# Patient Record
Sex: Male | Born: 1995 | Race: White | Hispanic: No | Marital: Single | Smoking: Never smoker
Health system: Southern US, Community
[De-identification: ages and names within clinical notes are randomized; demographics above are authoritative.]

## PROBLEM LIST (undated history)

## (undated) HISTORY — PX: OTHER SURGICAL HISTORY: SHX169

---

## 2019-07-07 ENCOUNTER — Other Ambulatory Visit: Payer: Self-pay

## 2019-07-07 ENCOUNTER — Encounter (HOSPITAL_COMMUNITY): Payer: Self-pay | Admitting: *Deleted

## 2019-07-07 ENCOUNTER — Emergency Department (HOSPITAL_COMMUNITY): Payer: 59

## 2019-07-07 ENCOUNTER — Emergency Department (HOSPITAL_COMMUNITY)
Admission: EM | Admit: 2019-07-07 | Discharge: 2019-07-08 | Disposition: A | Discharge status: Home / Self Care | Payer: 59 | Attending: Emergency Medicine | Admitting: Emergency Medicine

## 2019-07-07 DIAGNOSIS — R0789 Other chest pain: Secondary | ICD-10-CM

## 2019-07-07 DIAGNOSIS — R079 Chest pain, unspecified: Secondary | ICD-10-CM | POA: Insufficient documentation

## 2019-07-07 LAB — BASIC METABOLIC PANEL
Anion gap: 10 (ref 5–15)
BUN: 15 mg/dL (ref 6–20)
CO2: 26 mmol/L (ref 22–32)
Calcium: 9.3 mg/dL (ref 8.9–10.3)
Chloride: 102 mmol/L (ref 98–111)
Creatinine, Ser: 1.16 mg/dL (ref 0.61–1.24)
GFR calc Af Amer: >60 mL/min (ref >60)
GFR calc non Af Amer: >60 mL/min (ref >60)
Glucose, Bld: 109 mg/dL — ABNORMAL HIGH (ref 70–99)
Potassium: 3.4 mmol/L — ABNORMAL LOW (ref 3.5–5.1)
Sodium: 138 mmol/L (ref 135–145)

## 2019-07-07 LAB — CBC
HCT: 46.1 % (ref 39.0–52.0)
Hemoglobin: 15.9 g/dL (ref 13.0–17.0)
MCH: 29.1 pg (ref 26.0–34.0)
MCHC: 34.5 g/dL (ref 30.0–36.0)
MCV: 84.3 fL (ref 80.0–100.0)
Platelets: 261 10*3/uL (ref 150–400)
RBC: 5.47 MIL/uL (ref 4.22–5.81)
RDW: 12 % (ref 11.5–15.5)
WBC: 9.4 10*3/uL (ref 4.0–10.5)
nRBC: 0 % (ref 0.0–0.2)

## 2019-07-07 LAB — TROPONIN I (HIGH SENSITIVITY): Troponin I (High Sensitivity): 2 ng/L (ref <18)

## 2019-07-07 MED ORDER — SODIUM CHLORIDE 0.9% FLUSH: 3.0000 mL | Freq: Once | INTRAVENOUS | Status: DC

## 2019-07-07 NOTE — ED Triage Notes (Signed)
Pt reports SOB/CP for 3 days.Tightness in the center of his chest, reports increased SOB when walking. No cough or fevers. Also, some palpitations yesterday.

## 2019-07-08 LAB — TROPONIN I (HIGH SENSITIVITY): Troponin I (High Sensitivity): <2 ng/L (ref <18)

## 2019-07-08 NOTE — ED Provider Notes (Signed)
[facility]   CSN: [id number] Arrival date & time: 07/07/19  2125     History   Chief Complaint Chief Complaint  Patient presents with  . Chest Pain    HPI Gary Mckay is a 23 y.o. male.    23 year old male presents to the emergency department for evaluation of chest tightness.  He states that he began experiencing tightness in the center of his chest intermittently since resuming cardiovascular activity 3 days ago.  Has been unable to go to the gym due to COVID.  Tried to get into running, but began noticing tightness shortly after physical activity.  It does happen after working out, but may also recur while at rest.  Feels slightly short of breath when tightness is present.  It can last up to an hour before spontaneously resolving.  Patient denies taking any medications for his symptoms.  He has not had any loss of smell, loss of taste, cough, congestion, fevers, body aches, syncope or near syncope, hemoptysis, leg swelling, nausea or vomiting, recent surgeries or hospitalizations, prolonged travel.  Further denies use of hormone replacement therapies.  No known family history of coagulopathies or sudden cardiac death.  The history is provided by the patient. No language interpreter was used.  Chest Pain   History reviewed. No pertinent past medical history.  There are no active problems to display for this patient.   History reviewed. No pertinent surgical history.      Home Medications    Prior to Admission medications   Not on File    Family History No family history on file.  Social History Social History   Tobacco Use  . Smoking status: Not on file  Substance Use Topics  . Alcohol use: Yes  . Drug use: Never     Allergies   Patient has no known allergies.   Review of Systems Review of Systems  Cardiovascular: Positive for chest pain.  Ten systems reviewed and are negative for acute change,  except as noted in the HPI.   Physical Exam Updated Vital Signs BP 118/72   Pulse 62   Temp 98.6 F (37 C) (Oral)   Resp 16   SpO2 99%   Physical Exam Vitals signs and nursing note reviewed.  Constitutional:      General: He is not in acute distress.    Appearance: He is well-developed. He is not diaphoretic.     Comments: Nontoxic appearing and in NAD  HENT:     Head: Normocephalic and atraumatic.  Eyes:     General: No scleral icterus.    Conjunctiva/sclera: Conjunctivae normal.  Neck:     Musculoskeletal: Normal range of motion.  Cardiovascular:     Rate and Rhythm: Normal rate and regular rhythm.     Pulses: Normal pulses.  Pulmonary:     Effort: Pulmonary effort is normal. No respiratory distress.     Breath sounds: No stridor. No wheezing, rhonchi or rales.     Comments: Respirations even and unlabored. Lungs CTAB. Musculoskeletal: Normal range of motion.     Comments: No BLE edema  Skin:    General: Skin is warm and dry.     Coloration: Skin is not pale.     Findings: No erythema or rash.  Neurological:     General: No focal deficit present.     Mental Status: He is alert and oriented to person, place, and time.     Coordination: Coordination normal.  Psychiatric:        Behavior: Behavior normal.      ED Treatments / Results  Labs (all labs ordered are listed, but only abnormal results are displayed) Labs Reviewed  BASIC METABOLIC PANEL - Abnormal; Notable for the following components:      Result Value   Potassium 3.4 (*)    Glucose, Bld 109 (*)    All other components within normal limits  CBC  TROPONIN I (HIGH SENSITIVITY)  TROPONIN I (HIGH SENSITIVITY)    EKG EKG Interpretation  Date/Time:  Monday July 07 2019 21:35:45 EDT Ventricular Rate:  78 PR Interval:  124 QRS Duration: 96 QT Interval:  352 QTC Calculation: 401 R Axis:   55 Text Interpretation:  Normal sinus rhythm with sinus arrhythmia Normal ECG NO STEMI. No old tracing to  compare Confirmed by Addison Lank [phone]) on 07/08/2019 1:44:06 AM   Radiology Dg Chest 2 View  Result Date: 07/07/2019 CLINICAL DATA:  Shortness of breath, chest pain EXAM: CHEST - 2 VIEW COMPARISON:  None. FINDINGS: Heart and mediastinal contours are within normal limits. No focal opacities or effusions. No acute bony abnormality. IMPRESSION: No active cardiopulmonary disease. Electronically Signed   By: Rolm Baptise M.D.   On: 07/07/2019 22:32    Procedures Procedures (including critical care time)  Medications Ordered in ED Medications  sodium chloride flush (NS) 0.9 % injection 3 mL (has no administration in time range)     Initial Impression / Assessment and Plan / ED Course  I have reviewed the triage vital signs and the nursing notes.  Pertinent labs & imaging results that were available during my care of the patient were reviewed by me and considered in my medical decision making (see chart for details).        Patient presents to the emergency department for evaluation of chest tightness, intermittent x 3 days and onset after he began resuming cardiovascular exercise. Patient young, otherwise healthy.  EKG is nonischemic and troponin negative x 2.  Chest x-ray without evidence of mediastinal widening to suggest dissection.  No pneumothorax, pneumonia, pleural effusion.  Pulmonary embolus further considered; however, patient without tachycardia, tachypnea, dyspnea, hypoxia.  Patient is PERC negative.  Question costochondritis given onset after resuming physical activity.  Also discussed the possibility of silent reflux.  He has no personal history of anxiety, but does endorse a family history of this.  I believe he is stable for follow-up with a primary care doctor on an outpatient basis.  Return precautions discussed and provided. Patient discharged in stable condition with no unaddressed concerns.   Final Clinical Impressions(s) / ED Diagnoses   Final diagnoses:  Chest  tightness    ED Discharge Orders    None       Antonietta Breach, PA-C 07/08/19 0313    Fatima Blank, MD 07/08/19 [phone]

## 2019-07-08 NOTE — Discharge Instructions (Signed)
We recommend follow-up with a primary care doctor.  You had ED was reassuring today.  You may return for any new or concerning symptoms.

## 2020-04-11 ENCOUNTER — Other Ambulatory Visit: Payer: Self-pay

## 2020-04-11 ENCOUNTER — Encounter (HOSPITAL_COMMUNITY): Payer: Self-pay

## 2020-04-11 ENCOUNTER — Ambulatory Visit (HOSPITAL_COMMUNITY)
Admission: EM | Admit: 2020-04-11 | Discharge: 2020-04-11 | Disposition: A | Discharge status: Home / Self Care | Payer: 59 | Attending: Family Medicine | Admitting: Family Medicine

## 2020-04-11 ENCOUNTER — Ambulatory Visit (INDEPENDENT_AMBULATORY_CARE_PROVIDER_SITE_OTHER): Payer: 59

## 2020-04-11 DIAGNOSIS — S93401A Sprain of unspecified ligament of right ankle, initial encounter: Secondary | ICD-10-CM

## 2020-04-11 MED ORDER — IBUPROFEN 800 MG PO TABS: 800.0000 mg | ORAL_TABLET | Freq: Three times a day (TID) | ORAL | 0 refills | Status: AC

## 2020-04-11 NOTE — ED Provider Notes (Signed)
[facility]    CSN: [id number] Arrival date & time: 04/11/20  1528      History   Chief Complaint Chief Complaint  Patient presents with  . Ankle Injury    HPI Gary Mckay is a 24 y.o. male presenting today for evaluation of ankle injury. Patient was playing tennis earlier today, rolled ankle fell and felt a pop. Has developed increased pain and swelling to right ankle. Denies prior injury. Denies foot or knee pain. Significant pain with weight bearing.   HPI  History reviewed. No pertinent past medical history.  There are no problems to display for this patient.   Past Surgical History:  Procedure Laterality Date  . laproscopic shoulder         Home Medications    Prior to Admission medications   Medication Sig Start Date End Date Taking? Authorizing Provider  ibuprofen (ADVIL) 800 MG tablet Take 1 tablet (800 mg total) by mouth 3 (three) times daily. 04/11/20   Jennye Runquist, Junius Creamer, PA-C    Family History No family history on file.  Social History Social History   Tobacco Use  . Smoking status: Never Smoker  . Smokeless tobacco: Never Used  Substance Use Topics  . Alcohol use: Yes  . Drug use: Never     Allergies   Patient has no known allergies.   Review of Systems Review of Systems  Constitutional: Negative for fatigue and fever.  Eyes: Negative for redness, itching and visual disturbance.  Respiratory: Negative for shortness of breath.   Cardiovascular: Negative for chest pain and leg swelling.  Gastrointestinal: Negative for nausea and vomiting.  Musculoskeletal: Positive for arthralgias, gait problem and joint swelling. Negative for myalgias.  Skin: Negative for color change, rash and wound.  Neurological: Negative for dizziness, syncope, weakness, light-headedness and headaches.     Physical Exam Triage Vital Signs ED Triage Vitals  Enc Vitals Group     BP 04/11/20 1630 120/76     Pulse Rate 04/11/20 1630 72   Resp 04/11/20 1630 16     Temp 04/11/20 1630 98.6 F (37 C)     Temp Source 04/11/20 1630 Oral     SpO2 04/11/20 1630 100 %     Weight --      Height --      Head Circumference --      Peak Flow --      Pain Score 04/11/20 1632 1     Pain Loc --      Pain Edu? --      Excl. in GC? --    No data found.  Updated Vital Signs BP 120/76 (BP Location: Left Arm)   Pulse 72   Temp 98.6 F (37 C) (Oral)   Resp 16   SpO2 100%   Visual Acuity Right Eye Distance:   Left Eye Distance:   Bilateral Distance:    Right Eye Near:   Left Eye Near:    Bilateral Near:     Physical Exam Vitals and nursing note reviewed.  Constitutional:      Appearance: He is well-developed.     Comments: No acute distress  HENT:     Head: Normocephalic and atraumatic.     Nose: Nose normal.  Eyes:     Conjunctiva/sclera: Conjunctivae normal.  Cardiovascular:     Rate and Rhythm: Normal rate.  Pulmonary:     Effort: Pulmonary effort is normal. No respiratory distress.  Abdominal:     General:  There is no distension.  Musculoskeletal:        General: Normal range of motion.     Cervical back: Neck supple.     Comments: Right ankle: swelling about lateral malleolus, no discoloration, tender to palpation, nontender to palpation over medial malleolus and throughout dorsum of foot  Patient able to dorsiflex, although slowed  Dorsalis pedis 2+  Skin:    General: Skin is warm and dry.  Neurological:     Mental Status: He is alert and oriented to person, place, and time.      UC Treatments / Results  Labs (all labs ordered are listed, but only abnormal results are displayed) Labs Reviewed - No data to display  EKG   Radiology DG Ankle Complete Right  Result Date: 04/11/2020 CLINICAL DATA:  Right ankle pain after injury. Rolled ankle today and heard a snap. Swelling laterally. Unable to bear weight. EXAM: RIGHT ANKLE - COMPLETE 3+ VIEW COMPARISON:  None. FINDINGS: No fracture or  dislocation. The alignment and ankle mortise are preserved. There is soft tissue edema most prominent laterally. Moderate-sized ankle joint effusion. IMPRESSION: Lateral soft tissue edema and ankle joint effusion. No fracture or dislocation. Electronically Signed   By: Keith Rake M.D.   On: 04/11/2020 16:47    Procedures Procedures (including critical care time)  Medications Ordered in UC Medications - No data to display  Initial Impression / Assessment and Plan / UC Course  I have reviewed the triage vital signs and the nursing notes.  Pertinent labs & imaging results that were available during my care of the patient were reviewed by me and considered in my medical decision making (see chart for details).     No acute bony abnormality on xray, treating as sprain. Crutches- weight bear as tolerated. Anti-inflammatories, ice and elevate. Follow up with sports medicine if symptoms persisting.   Discussed strict return precautions. Patient verbalized understanding and is agreeable with plan.  Final Clinical Impressions(s) / UC Diagnoses   Final diagnoses:  Sprain of right ankle, unspecified ligament, initial encounter     Discharge Instructions     NO fracture Use anti-inflammatories for pain/swelling. You may take up to 800 mg Ibuprofen every 8 hours with food. You may supplement Ibuprofen with Tylenol [phone] mg every 8 hours.  Ice and elevate Follow up with sports medicine if not seeing much improvement over the next week Weight bear as tolerated Gradually ease back into sports as pain improving with ASO ankle brace   ED Prescriptions    Medication Sig Dispense Auth. Provider   ibuprofen (ADVIL) 800 MG tablet Take 1 tablet (800 mg total) by mouth 3 (three) times daily. 21 tablet Catalino Plascencia, [place] C, PA-C     PDMP not reviewed this encounter.   Janith Lima, [place] 04/11/20 2055

## 2020-04-11 NOTE — Discharge Instructions (Addendum)
NO fracture Use anti-inflammatories for pain/swelling. You may take up to 800 mg Ibuprofen every 8 hours with food. You may supplement Ibuprofen with Tylenol [phone] mg every 8 hours.  Ice and elevate Follow up with sports medicine if not seeing much improvement over the next week Weight bear as tolerated Gradually ease back into sports as pain improving with ASO ankle brace

## 2020-04-11 NOTE — ED Notes (Addendum)
No extra tall crutches in inventory. Patient states he is 6 feet.  Fitted patient with 5'10" crutches and fitting was good.  Educated patient on using the ASO and crutches.

## 2020-04-11 NOTE — ED Triage Notes (Signed)
Patient was playing tennis and fell. Reports he was able to walk unassisted after it initially happened, but unable to now.

## 2020-06-16 ENCOUNTER — Other Ambulatory Visit: Payer: Self-pay

## 2020-06-17 ENCOUNTER — Other Ambulatory Visit (HOSPITAL_COMMUNITY)
Admission: RE | Admit: 2020-06-17 | Discharge: 2020-06-17 | Disposition: A | Discharge status: Home / Self Care | Payer: 59 | Source: Ambulatory Visit | Attending: Internal Medicine | Admitting: Internal Medicine

## 2020-06-17 ENCOUNTER — Encounter: Payer: Self-pay | Admitting: Internal Medicine

## 2020-06-17 ENCOUNTER — Ambulatory Visit (INDEPENDENT_AMBULATORY_CARE_PROVIDER_SITE_OTHER): Payer: 59 | Admitting: Internal Medicine

## 2020-06-17 VITALS — BP 120/78 | HR 75 | Temp 98.1°F | Ht 72.0 in | Wt 191.7 lb

## 2020-06-17 DIAGNOSIS — S93401D Sprain of unspecified ligament of right ankle, subsequent encounter: Secondary | ICD-10-CM

## 2020-06-17 DIAGNOSIS — Z0000 Encounter for general adult medical examination without abnormal findings: Secondary | ICD-10-CM

## 2020-06-17 DIAGNOSIS — Z113 Encounter for screening for infections with a predominantly sexual mode of transmission: Secondary | ICD-10-CM | POA: Diagnosis present

## 2020-06-17 NOTE — Patient Instructions (Signed)
-Nice seeing you today!!  --Lab work today; will notify you once results are available.  -referral to sports medicine today.  -See you back in 1 year or sooner as needed.   Preventive Care 57-24 Years Old, Male Preventive care refers to lifestyle choices and visits with your health care provider that can promote health and wellness. This includes:  A yearly physical exam. This is also called an annual well check.  Regular dental and eye exams.  Immunizations.  Screening for certain conditions.  Healthy lifestyle choices, such as eating a healthy diet, getting regular exercise, not using drugs or products that contain nicotine and tobacco, and limiting alcohol use. What can I expect for my preventive care visit? Physical exam Your health care provider will check:  Height and weight. These may be used to calculate body mass index (BMI), which is a measurement that tells if you are at a healthy weight.  Heart rate and blood pressure.  Your skin for abnormal spots. Counseling Your health care provider may ask you questions about:  Alcohol, tobacco, and drug use.  Emotional well-being.  Home and relationship well-being.  Sexual activity.  Eating habits.  Work and work Statistician. What immunizations do I need?  Influenza (flu) vaccine  This is recommended every year. Tetanus, diphtheria, and pertussis (Tdap) vaccine  You may need a Td booster every 10 years. Varicella (chickenpox) vaccine  You may need this vaccine if you have not already been vaccinated. Human papillomavirus (HPV) vaccine  If recommended by your health care provider, you may need three doses over 6 months. Measles, mumps, and rubella (MMR) vaccine  You may need at least one dose of MMR. You may also need a second dose. Meningococcal conjugate (MenACWY) vaccine  One dose is recommended if you are 67-79 years old and a Market researcher living in a residence hall, or if you have one  of several medical conditions. You may also need additional booster doses. Pneumococcal conjugate (PCV13) vaccine  You may need this if you have certain conditions and were not previously vaccinated. Pneumococcal polysaccharide (PPSV23) vaccine  You may need one or two doses if you smoke cigarettes or if you have certain conditions. Hepatitis A vaccine  You may need this if you have certain conditions or if you travel or work in places where you may be exposed to hepatitis A. Hepatitis B vaccine  You may need this if you have certain conditions or if you travel or work in places where you may be exposed to hepatitis B. Haemophilus influenzae type b (Hib) vaccine  You may need this if you have certain risk factors. You may receive vaccines as individual doses or as more than one vaccine together in one shot (combination vaccines). Talk with your health care provider about the risks and benefits of combination vaccines. What tests do I need? Blood tests  Lipid and cholesterol levels. These may be checked every 5 years starting at age 52.  Hepatitis C test.  Hepatitis B test. Screening   Diabetes screening. This is done by checking your blood sugar (glucose) after you have not eaten for a while (fasting).  Sexually transmitted disease (STD) testing. Talk with your health care provider about your test results, treatment options, and if necessary, the need for more tests. Follow these instructions at home: Eating and drinking   Eat a diet that includes fresh fruits and vegetables, whole grains, lean protein, and low-fat dairy products.  Take vitamin and mineral supplements as  recommended by your health care provider.  Do not drink alcohol if your health care provider tells you not to drink.  If you drink alcohol: ? Limit how much you have to 0-2 drinks a day. ? Be aware of how much alcohol is in your drink. In the [place] one drink equals one 12 oz bottle of beer (355 mL), one 5  oz glass of wine (148 mL), or one 1 oz glass of hard liquor (44 mL). Lifestyle  Take daily care of your teeth and gums.  Stay active. Exercise for at least 30 minutes on 5 or more days each week.  Do not use any products that contain nicotine or tobacco, such as cigarettes, e-cigarettes, and chewing tobacco. If you need help quitting, ask your health care provider.  If you are sexually active, practice safe sex. Use a condom or other form of protection to prevent STIs (sexually transmitted infections). What's next?  Go to your health care provider once a year for a well check visit.  Ask your health care provider how often you should have your eyes and teeth checked.  Stay up to date on all vaccines. This information is not intended to replace advice given to you by your health care provider. Make sure you discuss any questions you have with your health care provider. Document Revised: 12/05/2018 Document Reviewed: 12/05/2018 Elsevier Patient Education  2020 [employer].

## 2020-06-17 NOTE — Progress Notes (Signed)
New Patient Office Visit     This visit occurred during the SARS-CoV-2 public health emergency.  Safety protocols were in place, including screening questions prior to the visit, additional usage of staff PPE, and extensive cleaning of exam room while observing appropriate contact time as indicated for disinfecting solutions.    CC/Reason for Visit: Establish care, annual preventive exam, discuss acute concerns Previous PCP: None Last Visit: Unknown  HPI: Gary Mckay is a 24 y.o. male who is coming in today for the above mentioned reasons.  He has no past medical history of significance.  In late April he suffered a right ankle sprain while playing tennis.  Visited an urgent care, x-rays were negative for fracture, he was given crutches and an ankle brace.  He has improved, however he will occasionally still have swelling and pain of his ankle while playing tennis.  He is requesting STD testing.  He has no symptoms, no penile discharge.  He has had 4 male sexual partners in the past year.  He always wears condoms.  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  He has no known drug allergies, past surgical history is significant for a right shoulder labrum repair following a football injury in 2018.  He has routine eye and dental care.  His vaccinations are up-to-date including Covid x2.   Past Medical/Surgical History: History reviewed. No pertinent past medical history.  Past Surgical History:  Procedure Laterality Date  . laproscopic shoulder      Social History:  reports that he has never smoked. He has never used smokeless tobacco. He reports current alcohol use. He reports that he does not use drugs.  Allergies: No Known Allergies  Family History:    Family History  Problem Relation Age of Onset  . Depression Sister   . Anorexia nervosa Sister   . CVA Paternal Grandmother      Current Outpatient Medications:  .  ibuprofen (ADVIL) 800 MG tablet, Take 1 tablet (800 mg total) by mouth 3 (three) times daily., Disp: 21 tablet, Rfl: 0 .  loratadine (CLARITIN) 10 MG tablet, Take 10 mg by mouth daily., Disp: , Rfl:   Review of Systems:  Constitutional: Denies fever, chills, diaphoresis, appetite change and fatigue.  HEENT: Denies photophobia, eye pain, redness, hearing loss, ear pain, congestion, sore throat, rhinorrhea, sneezing, mouth sores, trouble swallowing, neck pain, neck stiffness and tinnitus.   Respiratory: Denies SOB, DOE, cough, chest tightness,  and wheezing.   Cardiovascular: Denies chest pain, palpitations and leg swelling.  Gastrointestinal: Denies nausea, vomiting, abdominal pain, diarrhea, constipation, blood in stool and abdominal distention.  Genitourinary: Denies dysuria, urgency, frequency, hematuria, flank pain and difficulty urinating.  Endocrine: Denies: hot or cold intolerance, sweats, changes in hair or nails, polyuria, polydipsia. Musculoskeletal: Denies myalgias, back pain, joint swelling, arthralgias and gait problem.  Skin: Denies pallor, rash and wound.  Neurological: Denies dizziness, seizures, syncope, weakness, light-headedness, numbness and headaches.  Hematological: Denies adenopathy. Easy bruising, personal or family bleeding history  Psychiatric/Behavioral: Denies suicidal ideation, mood changes, confusion, nervousness, sleep disturbance and agitation    Physical Exam: Vitals:   06/17/20 1533  BP: 120/78  Pulse: 75  Temp: 98.1 F (36.7 C)  TempSrc: Temporal  SpO2: 99%  Weight: 191 lb 11.2 oz (87 kg)  Height: 6' (1.829 m)   Body mass index is 26 kg/m.  Constitutional: NAD, calm, comfortable Eyes: PERRL, lids and conjunctivae normal, wears corrective lenses ENMT: Mucous membranes are  moist.  Neck: normal, supple, no masses, no thyromegaly Respiratory: clear to auscultation bilaterally, no wheezing, no crackles. Normal respiratory effort. No accessory muscle use.  Cardiovascular: Regular rate and rhythm, no murmurs / rubs / gallops. No extremity edema.  Neurologic: CN 2-12 grossly intact. Sensation intact, DTR normal. Strength 5/5 in all 4.  Psychiatric: Normal judgment and insight. Alert and oriented x 3. Normal mood.    Impression and Plan:  Encounter for preventive health examination -He has routine eye and dental care. -Immunizations are up-to-date. -He is very physically active. -Commence routine colon cancer screening age 33 and prostate cancer screening age 64.  Screen for STD (sexually transmitted disease)  - Plan: Urine cytology ancillary only, HIV antibody (with reflex), RPR, Hepatitis panel, acute -Discussed safe sexual practices.  Sprain of right ankle, unspecified ligament, subsequent encounter  - Plan: Ambulatory referral to Sports Medicine    Patient Instructions  -Nice seeing you today!!  --Lab work today; will notify you once results are available.  -referral to sports medicine today.  -See you back in 1 year or sooner as needed.   Preventive Care 33-54 Years Old, Male Preventive care refers to lifestyle choices and visits with your health care provider that can promote health and wellness. This includes:  A yearly physical exam. This is also called an annual well check.  Regular dental and eye exams.  Immunizations.  Screening for certain conditions.  Healthy lifestyle choices, such as eating a healthy diet, getting regular exercise, not using drugs or products that contain nicotine and tobacco, and limiting alcohol use. What can I expect for my preventive care visit? Physical exam Your health care provider will check:  Height and weight. These may be used to calculate body mass index (BMI), which is a measurement that tells if  you are at a healthy weight.  Heart rate and blood pressure.  Your skin for abnormal spots. Counseling Your health care provider may ask you questions about:  Alcohol, tobacco, and drug use.  Emotional well-being.  Home and relationship well-being.  Sexual activity.  Eating habits.  Work and work Statistician. What immunizations do I need?  Influenza (flu) vaccine  This is recommended every year. Tetanus, diphtheria, and pertussis (Tdap) vaccine  You may need a Td booster every 10 years. Varicella (chickenpox) vaccine  You may need this vaccine if you have not already been vaccinated. Human papillomavirus (HPV) vaccine  If recommended by your health care provider, you may need three doses over 6 months. Measles, mumps, and rubella (MMR) vaccine  You may need at least one dose of MMR. You may also need a second dose. Meningococcal conjugate (MenACWY) vaccine  One dose is recommended if you are 50-6 years old and a Market researcher living in a residence hall, or if you have one of several medical conditions. You may also need additional booster doses. Pneumococcal conjugate (PCV13) vaccine  You may need this if you have certain conditions and were not previously vaccinated. Pneumococcal polysaccharide (PPSV23) vaccine  You may need one or two doses if you smoke cigarettes or if you have certain conditions. Hepatitis A vaccine  You may need this if you have certain conditions or if you travel or work in places where you may be exposed to hepatitis A. Hepatitis B vaccine  You  may need this if you have certain conditions or if you travel or work in places where you may be exposed to hepatitis B. Haemophilus influenzae type b (Hib) vaccine  You may need this if you have certain risk factors. You may receive vaccines as individual doses or as more than one vaccine together in one shot (combination vaccines). Talk with your health care provider about the risks  and benefits of combination vaccines. What tests do I need? Blood tests  Lipid and cholesterol levels. These may be checked every 5 years starting at age 40.  Hepatitis C test.  Hepatitis B test. Screening   Diabetes screening. This is done by checking your blood sugar (glucose) after you have not eaten for a while (fasting).  Sexually transmitted disease (STD) testing. Talk with your health care provider about your test results, treatment options, and if necessary, the need for more tests. Follow these instructions at home: Eating and drinking   Eat a diet that includes fresh fruits and vegetables, whole grains, lean protein, and low-fat dairy products.  Take vitamin and mineral supplements as recommended by your health care provider.  Do not drink alcohol if your health care provider tells you not to drink.  If you drink alcohol: ? Limit how much you have to 0-2 drinks a day. ? Be aware of how much alcohol is in your drink. In the [place] one drink equals one 12 oz bottle of beer (355 mL), one 5 oz glass of wine (148 mL), or one 1 oz glass of hard liquor (44 mL). Lifestyle  Take daily care of your teeth and gums.  Stay active. Exercise for at least 30 minutes on 5 or more days each week.  Do not use any products that contain nicotine or tobacco, such as cigarettes, e-cigarettes, and chewing tobacco. If you need help quitting, ask your health care provider.  If you are sexually active, practice safe sex. Use a condom or other form of protection to prevent STIs (sexually transmitted infections). What's next?  Go to your health care provider once a year for a well check visit.  Ask your health care provider how often you should have your eyes and teeth checked.  Stay up to date on all vaccines. This information is not intended to replace advice given to you by your health care provider. Make sure you discuss any questions you have with your health care provider. Document  Revised: 12/05/2018 Document Reviewed: 12/05/2018 Elsevier Patient Education  2020 [place], [place] [facility]

## 2020-06-18 LAB — RPR: RPR Ser Ql: NONREACTIVE

## 2020-06-18 LAB — HIV ANTIBODY (ROUTINE TESTING W REFLEX): HIV 1&2 Ab, 4th Generation: NONREACTIVE

## 2020-06-21 LAB — URINE CYTOLOGY ANCILLARY ONLY
Chlamydia: NEGATIVE
Comment: NEGATIVE
Comment: NEGATIVE
Comment: NORMAL
Neisseria Gonorrhea: NEGATIVE
Trichomonas: NEGATIVE

## 2020-06-23 ENCOUNTER — Ambulatory Visit: Payer: Self-pay

## 2020-06-23 ENCOUNTER — Encounter: Payer: Self-pay | Admitting: Family Medicine

## 2020-06-23 ENCOUNTER — Other Ambulatory Visit: Payer: Self-pay

## 2020-06-23 ENCOUNTER — Ambulatory Visit (INDEPENDENT_AMBULATORY_CARE_PROVIDER_SITE_OTHER): Payer: 59 | Admitting: Family Medicine

## 2020-06-23 VITALS — BP 120/72 | HR 70 | Ht 72.0 in | Wt 195.6 lb

## 2020-06-23 DIAGNOSIS — M25571 Pain in right ankle and joints of right foot: Secondary | ICD-10-CM | POA: Diagnosis not present

## 2020-06-23 NOTE — Progress Notes (Signed)
Subjective:    I'm seeing this patient as a consultation for:  Dr. Philip Aspen. Note will be routed back to referring provider/PCP.  CC: R ankle pain and swelling  I, Molly Weber, LAT, ATC, am serving as scribe for Dr. Clementeen Graham.  HPI: Pt is a 24 y/o male presenting w/ c/o R ankle pain and swelling.  He initially inured his R ankle in mid-April 2021 when he sprained his ankle while playing tennis.  He was initially seen at the [facility] on 04/11/20 and was provided w/ crutches and an ankle brace.  His R ankle pain has improved but he con't to have pain and swelling in his ankle w/ tennis activity.  He locates his pain to his R lateral ankle w/ some radiating pain into the R lower leg/calf.  Radiating pain: yes into the R lower leg calf R ankle swelling: yes Aggravating factors: tennis; cutting; doing legs at the gym Treatments tried: HEP w/ bands; ankle brace  Diagnostic imaging: R ankle XR- 04/11/20  Past medical history, Surgical history, Family history, Social history, Allergies, and medications have been entered into the medical record, reviewed.   Review of Systems: No new headache, visual changes, nausea, vomiting, diarrhea, constipation, dizziness, abdominal pain, skin rash, fevers, chills, night sweats, weight loss, swollen lymph nodes, body aches, joint swelling, muscle aches, chest pain, shortness of breath, mood changes, visual or auditory hallucinations.   Objective:    Vitals:   06/23/20 1523  BP: 120/72  Pulse: 70  SpO2: 98%   General: Well Developed, well nourished, and in no acute distress.  Neuro/Psych: Alert and oriented x3, extra-ocular muscles intact, able to move all 4 extremities, sensation grossly intact. Skin: Warm and dry, no rashes noted.  Respiratory: Not using accessory muscles, speaking in full sentences, trachea midline.  Cardiovascular: Pulses palpable, no extremity edema. Abdomen: Does not appear distended. MSK: Right ankle  mildly swollen at anterior lateral ankle near ATFL region and at lateral malleolus. Mildly tender to palpation at ATFL region. Normal range of motion. Laxity to anterior drawer and talar tilt testing. Intact strength. Normal gait.  Lab and Radiology Results DG Ankle Complete Right  Result Date: 04/11/2020 CLINICAL DATA:  Right ankle pain after injury. Rolled ankle today and heard a snap. Swelling laterally. Unable to bear weight. EXAM: RIGHT ANKLE - COMPLETE 3+ VIEW COMPARISON:  None. FINDINGS: No fracture or dislocation. The alignment and ankle mortise are preserved. There is soft tissue edema most prominent laterally. Moderate-sized ankle joint effusion. IMPRESSION: Lateral soft tissue edema and ankle joint effusion. No fracture or dislocation. Electronically Signed   By: Narda Rutherford M.D.   On: 04/11/2020 16:47   I, Clementeen Graham, personally (independently) visualized and performed the interpretation of the images attached in this note.  Diagnostic Limited MSK Ultrasound of: Right ankle laterally Soft tissue swelling present at right anterior lateral ankle and effusion present as well. No clear bony fracture or avulsion present. Peroneal tendons normal appearing at posterior lateral ankle. Impression: Effusion and soft tissue swelling   Impression and Recommendations:    Assessment and Plan: 24 y.o. male with ankle pain and swelling 2 months after ankle sprain.  Failing home exercise program and ASO bracing.  Discussed options.  Plan for compressive ankle sleeve Voltaren gel and trial of physical therapy.  If not improving after about 4 weeks patient will notify me and we will proceed with MRI for injection or surgical planning.Marland Kitchen  PDMP not reviewed this  encounter. Orders Placed This Encounter  Procedures  . [place] LIMITED JOINT SPACE STRUCTURES LOW RIGHT(NO LINKED CHARGES)    Order Specific Question:   Reason for Exam (SYMPTOM  OR DIAGNOSIS REQUIRED)    Answer:   R lateral ankle pain      Order Specific Question:   Preferred imaging location?    Answer:   Adult nurse [facility]  . Ambulatory referral to Physical Therapy    Referral Priority:   Routine    Referral Type:   Physical Medicine    Referral Reason:   Specialty Services Required    Requested Specialty:   Physical Therapy   No orders of the defined types were placed in this encounter.   Discussed warning signs or symptoms. Please see discharge instructions. Patient expresses understanding.   The above documentation has been reviewed and is accurate and complete Clementeen Graham, M.D.

## 2020-06-23 NOTE — Patient Instructions (Signed)
Thank you for coming in today.  Plan for PT.  Use body helix full ankle sleeve during activity and for 30-60 mins following activity.  Try topical voltaren gel up to 4x daily as needed for pain and swelling.  If not improving let me know in about 4 weeks.  Next step will be MRI.  Ok to contact me sooner if not doing well or having a problem.

## 2020-07-01 ENCOUNTER — Encounter: Payer: Self-pay | Admitting: Rehabilitative and Restorative Service Providers"

## 2020-07-01 ENCOUNTER — Other Ambulatory Visit: Payer: Self-pay

## 2020-07-01 ENCOUNTER — Ambulatory Visit (INDEPENDENT_AMBULATORY_CARE_PROVIDER_SITE_OTHER): Payer: 59 | Admitting: Rehabilitative and Restorative Service Providers"

## 2020-07-01 DIAGNOSIS — M6281 Muscle weakness (generalized): Secondary | ICD-10-CM

## 2020-07-01 DIAGNOSIS — R600 Localized edema: Secondary | ICD-10-CM

## 2020-07-01 DIAGNOSIS — M25571 Pain in right ankle and joints of right foot: Secondary | ICD-10-CM | POA: Diagnosis not present

## 2020-07-01 DIAGNOSIS — R262 Difficulty in walking, not elsewhere classified: Secondary | ICD-10-CM

## 2020-07-01 NOTE — Therapy (Signed)
[facility] Physical Therapy [address] [place], [place], [address] Phone: [phone]   Fax:  [phone]  Physical Therapy Evaluation  Patient Details  Name: Gary Mckay MRN: [id number] Date of Birth: 1996/08/26 Referring Provider (PT): Dr. Clementeen Graham   Encounter Date: 07/01/2020   PT End of Session - 07/01/20 1514    Visit Number 1    Number of Visits 16    Date for PT Re-Evaluation 08/26/20    Progress Note Due on Visit 10    PT Start Time 1519    PT Stop Time 1555    PT Time Calculation (min) 36 min    Activity Tolerance Patient tolerated treatment well    Behavior During Therapy [facility] for tasks assessed/performed           History reviewed. No pertinent past medical history.  Past Surgical History:  Procedure Laterality Date  . laproscopic shoulder      There were no vitals filed for this visit.    Subjective Assessment - 07/01/20 1513    Subjective Pt. indicated onset of symptoms c spraining ankle while playing tennis.  Initially saw Urgent Care and xray performed negative at that time.  Pt. stated he initially started doing some home exercise activity with some improvement.  Pt. stated he was still having pain and swelling with increased activity and that led to additional visit to MD.  Ultrasound was performed showing ligament laxity per MD.  Has brace he uses with activity.   Workouts 4 x week at gym    Pertinent History Last MD visit update: HPI: Pt is a 24 y/o male presenting w/ c/o R ankle pain and swelling.  He initially inured his R ankle in mid-April 2021 when he sprained his ankle while playing tennis.  He was initially seen at the [facility] on 04/11/20 and was provided w/ crutches and an ankle brace.  His R ankle pain has improved but he continued to have pain and swelling in his ankle w/ tennis activity.  He locates his pain to his R lateral ankle w/ some radiating pain into the R lower leg/calf.    Limitations  Standing;Walking    Diagnostic tests Xray, ultrasoud    Patient Stated Goals Reduce pain, improve function, return to sport.    Currently in Pain? Yes    Pain Score 4    at worst   Pain Location Ankle    Pain Orientation Right    Pain Descriptors / Indicators Aching;Dull    Pain Type Chronic pain   over 6 weeks   Pain Onset More than a month ago    Pain Frequency Intermittent    Aggravating Factors  Tennis movements, uneven surface activity, heavy workouts, morning pain    Pain Relieving Factors OTC medicine, rest, band exercises.    Effect of Pain on Daily Activities Limited in recreational activity.              [facility] PT Assessment - 07/01/20 0001      Assessment   Medical Diagnosis Rt ankle pain    Referring Provider (PT) Dr. Clementeen Graham    Onset Date/Surgical Date 04/08/20    Hand Dominance Right    Prior Therapy Physical therapy s/p shoulder surgery      Precautions   Precautions None      Restrictions   Weight Bearing Restrictions No      Balance Screen   Has the patient fallen in the past 6  months No   none outside of recreational sport   Has the patient had a decrease in activity level because of a fear of falling?  Yes   2/2 pain   Is the patient reluctant to leave their home because of a fear of falling?  No      Home Environment   Living Environment Private residence    Type of Home Apartment    Home Layout One level      Prior Function   Level of Independence Independent    Leisure tennis, recreational lifting      Cognition   Overall Cognitive Status Within Functional Limits for tasks assessed      Observation/Other Assessments-Edema    Edema Figure 8      Figure 8 Edema   Figure 8 - Right  21.5 inches    Figure 8 - Left  21 inches      Sensation   Light Touch Appears Intact      Functional Tests   Functional tests Squat;Single leg stance      Squat   Comments DF tightness on Rt in lower end of squat movement       Single Leg Stance    Comments equal at 30 seconds c mild aberrant movement       ROM / Strength   AROM / PROM / Strength Strength;PROM;AROM      AROM   Overall AROM Comments Mild tightness in DF Rt ankle    AROM Assessment Site Ankle    Right/Left Ankle Left;Right    Right Ankle Dorsiflexion 8    Right Ankle Inversion 40    Right Ankle Eversion 20    Left Ankle Dorsiflexion 14      PROM   Overall PROM Comments equal to AROM on Rt ankle    PROM Assessment Site Ankle    Right/Left Ankle Left;Right      Strength   Strength Assessment Site Ankle;Knee;Hip    Right/Left Hip Left;Right    Right Hip Flexion 5/5    Left Hip Flexion 5/5    Right/Left Knee Left;Right    Right Knee Flexion 5/5    Right Knee Extension 5/5    Left Knee Flexion 5/5    Left Knee Extension 5/5    Right/Left Ankle Left;Right    Right Ankle Dorsiflexion 5/5    Right Ankle Plantar Flexion 5/5    Right Ankle Inversion 4/5    Right Ankle Eversion 5/5    Left Ankle Dorsiflexion 5/5    Left Ankle Plantar Flexion 5/5    Left Ankle Inversion 5/5    Left Ankle Eversion 5/5      Palpation   Palpation comment Mild tenderness at localized edema anterior/inferior to lateral malleolus      Special Tests   Other special tests Mild restriction in talocrural jt mobility on Rt vs. Lt      Ambulation/Gait   Gait Comments Mild DF limitation in toe off progression on Rt LE                      Objective measurements completed on examination: See above findings.       [facility] Adult PT Treatment/Exercise - 07/01/20 0001      Self-Care   Self-Care Other Self-Care Comments    Other Self-Care Comments  Self care education on swelling management c elevation, ice and compression       Exercises   Exercises Other Exercises  Other Exercises  Tband 4 way blue band 3 x 10, SLS 30 seconds x 3, - HEP instruction/performance c verbal and tacitile cues      Manual Therapy   Manual therapy comments g3/g4 ap talocrural jt mobs Rt  ankle                  PT Education - 07/01/20 1511    Education Details HEP, POC    Person(s) Educated Patient    Methods Explanation;Demonstration;Verbal cues;Handout    Comprehension Verbalized understanding;Returned demonstration               PT Long Term Goals - 07/01/20 1600      PT LONG TERM GOAL #1   Title Patient will demonstrate/report pain at worst less than or equal to 2/10 to facilitate minimal limitation in daily activity secondary to pain symptoms.    Status New    Target Date 08/26/20      PT LONG TERM GOAL #2   Title Patient will demonstrate independent use of home exercise program to facilitate ability to maintain/progress functional gains from skilled physical therapy services.    Status New    Target Date 08/26/20      PT LONG TERM GOAL #3   Title Patient will demonstrate return to work/recreational activity at previous level of function without limitations secondary due to condition    Status New    Target Date 08/26/20      PT LONG TERM GOAL #4   Title Pt. will demonstrate Rt ankle DF equal to Lt to facilitate normalized movement in gait and squat movements at PLOF.    Status New    Target Date 08/26/20      PT LONG TERM GOAL #5   Title Pt. will demonstrate Rt ankle MMT 5/5 throughout to facilitate return to PLOF in recreational activity.    Status New    Target Date 08/26/20                  Plan - 07/01/20 1511    Clinical Impression Statement Patient is a  24 y.o. male who comes to clinic with complaints of Rt ankle pain with mobility, strength and movement coordination deficits that impair their ability to perform usual daily and recreational functional activities without increase difficulty/symptoms at this time.  Patient to benefit from skilled PT services to address impairments and limitations to improve to previous level of function without restriction secondary to condition.    Examination-Activity Limitations  Squat;Stairs;Stand;Locomotion Level    Examination-Participation Restrictions Community Activity    Stability/Clinical Decision Making Stable/Uncomplicated    Clinical Decision Making Low    Rehab Potential Good    PT Frequency 2x / week    PT Duration 8 weeks   up to 8 weeks   PT Treatment/Interventions ADLs/Self Care Home Management;Electrical Stimulation;Iontophoresis 4mg /ml Dexamethasone;Moist Heat;Balance training;Therapeutic exercise;Therapeutic activities;Functional mobility training;Stair training;Cryotherapy;Gait training;Patient/family education;Ultrasound;Neuromuscular re-education;Manual techniques;Vasopneumatic Device;Taping;Dry needling;Passive range of motion;Spinal Manipulations;Joint Manipulations    PT Next Visit Plan Review HEP, strength/balance in WB as tolerated.  DF mobility    PT Home Exercise Plan [facility]    Consulted and Agree with Plan of Care Patient           Patient will benefit from skilled therapeutic intervention in order to improve the following deficits and impairments:  Abnormal gait, Decreased endurance, Hypomobility, Decreased activity tolerance, Decreased strength, Pain, Difficulty walking, Decreased mobility, Decreased balance, Decreased range of motion, Impaired perceived functional ability, Decreased coordination  Visit Diagnosis: Pain in right ankle and joints of right foot - Plan: PT plan of care cert/re-cert  Muscle weakness (generalized) - Plan: PT plan of care cert/re-cert  Difficulty in walking, not elsewhere classified - Plan: PT plan of care cert/re-cert  Localized edema - Plan: PT plan of care cert/re-cert     Problem List There are no problems to display for this patient.   Chyrel Masson, PT, DPT, OCS, ATC 07/01/20  4:21 PM    [facility] Physical Therapy [address] [place], [place], [address] Phone: [phone]   Fax:  [phone]  Name: Gary Mckay MRN: [id number] Date of Birth:  [birth year]

## 2020-07-01 NOTE — Patient Instructions (Signed)
Access Code: [email or web address] URL: [email or web address] Date: 07/01/2020 Prepared by: Chyrel Masson  Exercises Long Sitting Ankle Eversion with Resistance - 2 x daily - 7 x weekly - 10 reps - 3 sets Long Sitting Ankle Plantar Flexion with Resistance - 2 x daily - 7 x weekly - 10 reps - 3 sets Long Sitting Ankle Inversion with Resistance - 2 x daily - 7 x weekly - 10 reps - 3 sets Long Sitting Ankle Dorsiflexion with Anchored Resistance - 2 x daily - 7 x weekly - 10 reps - 3 sets Single Leg Stance - 1 x daily - 7 x weekly - 3 sets - 10 reps

## 2020-07-14 ENCOUNTER — Ambulatory Visit (INDEPENDENT_AMBULATORY_CARE_PROVIDER_SITE_OTHER): Payer: 59 | Admitting: Family Medicine

## 2020-07-14 ENCOUNTER — Other Ambulatory Visit: Payer: Self-pay

## 2020-07-14 ENCOUNTER — Encounter: Payer: Self-pay | Admitting: Physical Therapy

## 2020-07-14 ENCOUNTER — Ambulatory Visit (INDEPENDENT_AMBULATORY_CARE_PROVIDER_SITE_OTHER): Payer: 59 | Admitting: Physical Therapy

## 2020-07-14 ENCOUNTER — Encounter: Payer: Self-pay | Admitting: Family Medicine

## 2020-07-14 ENCOUNTER — Ambulatory Visit: Payer: Self-pay

## 2020-07-14 VITALS — BP 122/72 | HR 51 | Ht 72.0 in | Wt 195.0 lb

## 2020-07-14 DIAGNOSIS — M25512 Pain in left shoulder: Secondary | ICD-10-CM

## 2020-07-14 DIAGNOSIS — R262 Difficulty in walking, not elsewhere classified: Secondary | ICD-10-CM

## 2020-07-14 DIAGNOSIS — M25571 Pain in right ankle and joints of right foot: Secondary | ICD-10-CM

## 2020-07-14 DIAGNOSIS — M6281 Muscle weakness (generalized): Secondary | ICD-10-CM | POA: Diagnosis not present

## 2020-07-14 DIAGNOSIS — R600 Localized edema: Secondary | ICD-10-CM | POA: Diagnosis not present

## 2020-07-14 NOTE — Therapy (Signed)
[facility] Physical Therapy [address] [place], [place], [address] Phone: [phone]   Fax:  [phone]  Physical Therapy Treatment  Patient Details  Name: Gary Mckay MRN: [id number] Date of Birth: [birth year] Referring Provider (PT): Dr. Clementeen Graham   Encounter Date: 07/14/2020   PT End of Session - 07/14/20 1614    Visit Number 2    Number of Visits 16    Date for PT Re-Evaluation 08/26/20    Progress Note Due on Visit 10    PT Start Time 1517    PT Stop Time 1555    PT Time Calculation (min) 38 min    Activity Tolerance Patient tolerated treatment well    Behavior During Therapy [facility] for tasks assessed/performed           History reviewed. No pertinent past medical history.  Past Surgical History:  Procedure Laterality Date   laproscopic shoulder      There were no vitals filed for this visit.   Subjective Assessment - 07/14/20 1546    Subjective Pt arriving to therapy reporting no pain in his right ankle. Pt did report new anke sprain in his left ankle at the beach last [place]. R ankle is doing well.  nset of Left shoulder pain.    Pertinent History Last MD visit update: HPI: Pt is a 24 y/o male presenting w/ c/o R ankle pain and swelling.  He initially inured his R ankle in mid-April 2021 when he sprained his ankle while playing tennis.  He was initially seen at the [facility] on 04/11/20 and was provided w/ crutches and an ankle brace.  His R ankle pain has improved but he continued to have pain and swelling in his ankle w/ tennis activity.  He locates his pain to his R lateral ankle w/ some radiating pain into the R lower leg/calf.    Limitations Standing;Walking    Diagnostic tests Xray, ultrasoud    Patient Stated Goals Reduce pain, improve function, return to sport.    Currently in Pain? No/denies    Pain Location --              [facility] PT Assessment - 07/14/20 0001      Assessment   Medical Diagnosis Rt ankle  pain    Referring Provider (PT) Dr. Clementeen Graham    Onset Date/Surgical Date 04/08/20    Hand Dominance Right      AROM   AROM Assessment Site Ankle    Right/Left Ankle Right    Right Ankle Dorsiflexion 10    Right Ankle Inversion 38    Right Ankle Eversion 20                         [facility] Adult PT Treatment/Exercise - 07/14/20 0001      Exercises   Exercises Ankle    Other Exercises  table slides in flexion and ER for shoulders      Ankle Exercises: Stretches   Soleus Stretch 3 reps;20 seconds    Gastroc Stretch Limitations lunge position holding 30 seconds x 2     Slant Board Stretch 3 reps;30 seconds      Ankle Exercises: Aerobic   Recumbent Bike L3 x 6 minutes      Ankle Exercises: Standing   Vector Stance Limitations R LE stance with reaching toward 3 cones (forward, side and back)    Rocker Board 3 minutes;Limitations    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 positions    Heel Raises Both;15 reps    Toe Raise 15 reps      Ankle Exercises: Seated   BAPS Sitting;Level 4   x 20 counter clock wise and clockwise                 PT Education - 07/14/20 1613    Education Details PT POC for ankle and shoulder, Edu on movement strategies    Person(s) Educated Patient    Methods Explanation    Comprehension Verbalized understanding               PT Long Term Goals - 07/01/20 1600      PT LONG TERM GOAL #1   Title Patient will demonstrate/report pain at worst less than or equal to 2/10 to facilitate minimal limitation in daily activity secondary to pain symptoms.    Status New    Target Date 08/26/20      PT LONG TERM GOAL #2   Title Patient will demonstrate independent use of home exercise program to facilitate ability to maintain/progress functional gains from skilled physical therapy services.    Status New    Target Date 08/26/20      PT LONG TERM GOAL #3   Title Patient will demonstrate return to work/recreational activity at  previous level of function without limitations secondary due to condition    Status New    Target Date 08/26/20      PT LONG TERM GOAL #4   Title Pt. will demonstrate Rt ankle DF equal to Lt to facilitate normalized movement in gait and squat movements at PLOF.    Status New    Target Date 08/26/20      PT LONG TERM GOAL #5   Title Pt. will demonstrate Rt ankle MMT 5/5 throughout to facilitate return to PLOF in recreational activity.    Status New    Target Date 08/26/20                 Plan - 07/14/20 1617    Clinical Impression Statement Pt arriving to therapy reporting no pain in his right ankle. Pt did report new anke sprain in his left ankle at the beach last week. R ankle is doing well.  Pt tolerating high level balance exericses progressing with single leg stance activites. Still mild edema noted around R lateral malleolus. Pt also reporting new onset of Left shoulder pain. Pt reporting appointment with Dr. Denyse Amass this morning. Shoulder to be assessed at next visit. Pt was issued table slides for flexion and ER stretching which he tolerated well today. Continue skilled PT.    Examination-Activity Limitations Squat;Stairs;Stand;Locomotion Level    Examination-Participation Restrictions Community Activity    Stability/Clinical Decision Making Stable/Uncomplicated    Rehab Potential Good    PT Frequency 2x / week    PT Duration 8 weeks    PT Treatment/Interventions ADLs/Self Care Home Management;Electrical Stimulation;Iontophoresis 4mg /ml Dexamethasone;Moist Heat;Balance training;Therapeutic exercise;Therapeutic activities;Functional mobility training;Stair training;Cryotherapy;Gait training;Patient/family education;Ultrasound;Neuromuscular re-education;Manual techniques;Vasopneumatic Device;Taping;Dry needling;Passive range of motion;Spinal Manipulations;Joint Manipulations    PT Next Visit Plan Review HEP, strength/balance in WB as tolerated.  DF mobility    PT Home Exercise  Plan [facility]    Consulted and Agree with Plan of Care Patient           Patient will benefit from skilled therapeutic intervention in order to improve the following deficits and impairments:  Abnormal gait, Decreased endurance, Hypomobility, Decreased activity tolerance, Decreased strength,  Pain, Difficulty walking, Decreased mobility, Decreased balance, Decreased range of motion, Impaired perceived functional ability, Decreased coordination  Visit Diagnosis: Pain in right ankle and joints of right foot  Muscle weakness (generalized)  Difficulty in walking, not elsewhere classified  Localized edema     Problem List There are no problems to display for this patient.   Sharmon Leyden, PT, MPT 07/14/2020, 4:27 PM  [facility] Physical Therapy [address] [place], [place], [address] Phone: [phone]   Fax:  [phone]  Name: Beren Yniguez MRN: [id number] Date of Birth: [birth year]

## 2020-07-14 NOTE — Progress Notes (Signed)
   Wynema Birch, am serving as a Neurosurgeon for Dr. Antoine Primas.  Gary Mckay is a 24 y.o. male who presents to [facility] today for his L shoulder.  He was last seen by Dr. Denyse Amass on 06/23/20 for his R ankle and was advised to use a Body Helix ankle sleeve and Voltaren gel.  Since his last visit, pt reports in gyn on July 3rd for shoulder day went up in weight on arnold presses on third rep felt shoulder twinge. Has had torn labrum in that shoulder in the past and had surgery. Tightness in trap area and since then shoulder has been worse with weakness soreness and worse in morning. Sometimes a pinching feeling and noticed cracking. Worried about rotator cuff issue.    Pertinent review of systems: No fevers or chills  Relevant historical information: Largely healthy   Exam:  BP 122/72 (BP Location: Left Arm, Patient Position: Sitting, Cuff Size: Normal)   Pulse (!) 51   Ht 6' (1.829 m)   Wt 195 lb (88.5 kg)   SpO2 99%   BMI 26.45 kg/m  General: Well Developed, well nourished, and in no acute distress.   MSK: Left shoulder normal-appearing Nontender. Normal motion. Intact strength. Negative Hawkins and Neer's test. Negative Yergason's and speeds test. Positive O'Brien test. Minimally positive anterior apprehension test and relocation test.    Assessment and Plan: 24 y.o. male with left shoulder pain with weightlifting.  Likely rotator cuff strain versus very unlikely labrum injury.  Plan for physical therapy and recheck in a month.  Patient already has physical therapy scheduled for his ankle sprain and this should be an easy add-on.  If needed x-ray ultrasound injection etc.    Orders Placed This Encounter  Procedures  . Ambulatory referral to Physical Therapy    Referral Priority:   Routine    Referral Type:   Physical Medicine    Referral Reason:   Specialty Services Required    Requested Specialty:   Physical Therapy   No  orders of the defined types were placed in this encounter.    Discussed warning signs or symptoms. Please see discharge instructions. Patient expresses understanding.   The above documentation has been reviewed and is accurate and complete Clementeen Graham, M.D.

## 2020-07-14 NOTE — Patient Instructions (Signed)
Thank you for coming in today. Plan for PT. Recheck in about 1 month.

## 2020-07-28 ENCOUNTER — Ambulatory Visit (INDEPENDENT_AMBULATORY_CARE_PROVIDER_SITE_OTHER): Payer: 59 | Admitting: Rehabilitative and Restorative Service Providers"

## 2020-07-28 ENCOUNTER — Other Ambulatory Visit: Payer: Self-pay

## 2020-07-28 DIAGNOSIS — M6281 Muscle weakness (generalized): Secondary | ICD-10-CM | POA: Diagnosis not present

## 2020-07-28 DIAGNOSIS — M25512 Pain in left shoulder: Secondary | ICD-10-CM | POA: Diagnosis not present

## 2020-07-28 DIAGNOSIS — M25571 Pain in right ankle and joints of right foot: Secondary | ICD-10-CM | POA: Diagnosis not present

## 2020-07-28 DIAGNOSIS — R600 Localized edema: Secondary | ICD-10-CM

## 2020-07-28 DIAGNOSIS — R262 Difficulty in walking, not elsewhere classified: Secondary | ICD-10-CM

## 2020-07-28 NOTE — Therapy (Signed)
[facility] Physical Therapy [address] [place], [place], [address] Phone: [phone]   Fax:  [phone]  Physical Therapy Treatment/Recertification  Patient Details  Name: Gary Mckay MRN: [id number] Date of Birth: May 27, 1996 Referring Provider (PT): Dr. Clementeen Graham   Encounter Date: 07/28/2020   PT End of Session - 07/28/20 1628    Visit Number 3    Number of Visits 16    Date for PT Re-Evaluation 09/08/20    Progress Note Due on Visit 10    PT Start Time 1601    PT Stop Time 1640    PT Time Calculation (min) 39 min    Activity Tolerance Patient tolerated treatment well    Behavior During Therapy [facility] for tasks assessed/performed           No past medical history on file.  Past Surgical History:  Procedure Laterality Date  . laproscopic shoulder      There were no vitals filed for this visit.   Subjective Assessment - 07/28/20 1606    Subjective Pt. indicated Lt shoulder was hurting after doing some lifting overhead press with increased weight.  Felt twinge in shoulder and went down in weight and still had pain. Pt. stated having weakness and instability in shoulder.  Pt. stated pain started mild but then got worse over several weeks.  Pt. stated he has been doing some stretching that has seemed to help symptoms.  Pt. stated having avoid lifting weights with it at this time.  Previous history of shoulder surgery.    Pertinent History Last MD visit update: HPI: Pt is a 24 y/o male presenting w/ c/o R ankle pain and swelling.  He initially inured his R ankle in mid-April 2021 when he sprained his ankle while playing tennis.  He was initially seen at the [facility] on 04/11/20 and was provided w/ crutches and an ankle brace.  His R ankle pain has improved but he continued to have pain and swelling in his ankle w/ tennis activity.  He locates his pain to his R lateral ankle w/ some radiating pain into the R lower leg/calf.    Limitations  Standing;Walking    Diagnostic tests Xray, ultrasoud    Patient Stated Goals Reduce pain, improve function, return to sport.    Currently in Pain? Yes    Pain Score 0-No pain   at worst 3/10   Pain Orientation Right    Pain Descriptors / Indicators Aching    Pain Onset More than a month ago    Pain Frequency Occasional    Aggravating Factors  running.    Pain Relieving Factors HEP    Effect of Pain on Daily Activities recreational activity    Multiple Pain Sites Yes    Pain Score 2   at worst 3/10   Pain Location Shoulder    Pain Orientation Left    Pain Descriptors / Indicators Aching;Sharp    Pain Type Acute pain    Pain Onset More than a month ago    Pain Frequency Intermittent    Aggravating Factors  Overhead reaching/lifting, reaching behind back    Pain Relieving Factors some of the stretches    Effect of Pain on Daily Activities Limiting in workout activity.              [facility] PT Assessment - 07/28/20 0001      Assessment   Medical Diagnosis Rt ankle pain, Lt shoulder pain    Referring Provider (  PT) Dr. Clementeen Graham    Onset Date/Surgical Date 04/08/20   06/25/2020 shoulder   Hand Dominance Right      Precautions   Precautions None      Balance Screen   Has the patient fallen in the past 6 months No    Has the patient had a decrease in activity level because of a fear of falling?  Yes   2/2 condition   Is the patient reluctant to leave their home because of a fear of falling?  No      Home Environment   Living Environment Private residence    Type of Home Apartment    Home Layout One level      Prior Function   Level of Independence Independent      AROM   Overall AROM Comments Rt HBB T6, Lt HBB T5.  WFL Lt GH jt AROM    AROM Assessment Site Shoulder    Right/Left Shoulder Left;Right      PROM   PROM Assessment Site Shoulder    Right/Left Shoulder Left;Right      Strength   Strength Assessment Site Shoulder    Right/Left Shoulder Left;Right    Right  Shoulder Flexion 5/5    Right Shoulder ABduction 5/5    Right Shoulder Internal Rotation 5/5    Right Shoulder External Rotation 5/5    Left Shoulder Flexion 4+/5    Left Shoulder ABduction 5/5    Left Shoulder Internal Rotation 5/5    Left Shoulder External Rotation 5/5      Palpation   Palpation comment TrP noted in Lt infraspinatus c concordant symptoms                         [facility] Adult PT Treatment/Exercise - 07/28/20 0001      Exercises   Exercises Shoulder      Manual Therapy   Manual therapy comments compression to Lt infraspinatus, prom            Trigger Point Dry Needling - 07/28/20 0001    Consent Given? Yes    Education Handout Provided Yes    Muscles Treated Upper Quadrant Infraspinatus   Lt   Infraspinatus Response Twitch response elicited                PT Education - 07/28/20 1628    Education Details DN    Person(s) Educated Patient    Methods Explanation;Verbal cues;Handout    Comprehension Verbalized understanding               PT Long Term Goals - 07/28/20 1625      PT LONG TERM GOAL #1   Title Patient will demonstrate/report pain at worst less than or equal to 2/10 to facilitate minimal limitation in daily activity secondary to pain symptoms.    Status On-going    Target Date 09/08/20      PT LONG TERM GOAL #2   Title Patient will demonstrate independent use of home exercise program to facilitate ability to maintain/progress functional gains from skilled physical therapy services.    Status On-going    Target Date 09/08/20      PT LONG TERM GOAL #3   Title Patient will demonstrate return to work/recreational activity at previous level of function without limitations secondary due to condition    Status On-going    Target Date 09/08/20      PT LONG TERM GOAL #4  Title Pt. will demonstrate Rt ankle DF equal to Lt to facilitate normalized movement in gait and squat movements at PLOF.    Status On-going     Target Date 09/08/20      PT LONG TERM GOAL #5   Title Pt. will demonstrate Rt ankle MMT 5/5 throughout to facilitate return to PLOF in recreational activity.    Status On-going    Target Date 09/08/20      Additional Long Term Goals   Additional Long Term Goals Yes      PT LONG TERM GOAL #6   Title Pt. will demonstrate Lt UE MMT 5/5 throughout to facilitate usual daily and recreational activity at PLOF s limitation.    Status New    Target Date 09/08/20                 Plan - 07/28/20 1631    Clinical Impression Statement Patient is a 24 y.o. male who comes to clinic with complaints of Lt shoulder pain, Rt ankle/foot pain with mobility, strength and movement coordination deficits that impair their ability to perform usual daily and recreational functional activities without increase difficulty/symptoms at this time.  Patient to benefit from skilled PT services to address impairments and limitations to improve to previous level of function without restriction secondary to condition.    Examination-Activity Limitations Squat;Stairs;Stand;Locomotion Level    Examination-Participation Restrictions Community Activity    Stability/Clinical Decision Making Stable/Uncomplicated    Clinical Decision Making Low    Rehab Potential Good    PT Frequency 2x / week   1-2x/week   PT Duration 6 weeks    PT Treatment/Interventions ADLs/Self Care Home Management;Electrical Stimulation;Iontophoresis 4mg /ml Dexamethasone;Moist Heat;Balance training;Therapeutic exercise;Therapeutic activities;Functional mobility training;Stair training;Cryotherapy;Gait training;Patient/family education;Ultrasound;Neuromuscular re-education;Manual techniques;Vasopneumatic Device;Taping;Dry needling;Passive range of motion;Spinal Manipulations;Joint Manipulations    PT Next Visit Plan DN if warranted.  Continued ankle stability/agility intervention    PT Home Exercise Plan [facility]    Consulted and Agree with Plan of  Care Patient           Patient will benefit from skilled therapeutic intervention in order to improve the following deficits and impairments:  Abnormal gait, Decreased endurance, Hypomobility, Decreased activity tolerance, Decreased strength, Pain, Difficulty walking, Decreased mobility, Decreased balance, Decreased range of motion, Impaired perceived functional ability, Decreased coordination, Impaired tone, Increased edema, Impaired UE functional use, Increased fascial restricitons  Visit Diagnosis: Pain in right ankle and joints of right foot  Muscle weakness (generalized)  Acute pain of left shoulder  Difficulty in walking, not elsewhere classified  Localized edema     Problem List There are no problems to display for this patient.  [facility], PT, DPT, OCS, ATC 07/28/20  4:39 PM    [facility] Physical Therapy [address] [place], [place], [place] Phone: [phone]   Fax:  [phone]  Name: Gary Mckay MRN: [name] Date of Birth: 01-12-1996

## 2020-08-03 ENCOUNTER — Ambulatory Visit (INDEPENDENT_AMBULATORY_CARE_PROVIDER_SITE_OTHER): Payer: 59 | Admitting: Rehabilitative and Restorative Service Providers"

## 2020-08-03 ENCOUNTER — Encounter: Payer: Self-pay | Admitting: Rehabilitative and Restorative Service Providers"

## 2020-08-03 ENCOUNTER — Other Ambulatory Visit: Payer: Self-pay

## 2020-08-03 DIAGNOSIS — M6281 Muscle weakness (generalized): Secondary | ICD-10-CM

## 2020-08-03 DIAGNOSIS — M25571 Pain in right ankle and joints of right foot: Secondary | ICD-10-CM

## 2020-08-03 DIAGNOSIS — R262 Difficulty in walking, not elsewhere classified: Secondary | ICD-10-CM | POA: Diagnosis not present

## 2020-08-03 DIAGNOSIS — R600 Localized edema: Secondary | ICD-10-CM

## 2020-08-03 DIAGNOSIS — M25512 Pain in left shoulder: Secondary | ICD-10-CM

## 2020-08-03 NOTE — Therapy (Signed)
[facility] Physical Therapy [address] [place], [place], [address] Phone: [phone]   Fax:  [phone]  Physical Therapy Treatment  Patient Details  Name: Gary Mckay MRN: [id number] Date of Birth: Oct 28, 1996 Referring Provider (PT): Dr. Clementeen Graham   Encounter Date: 08/03/2020   PT End of Session - 08/03/20 1604    Visit Number 4    Number of Visits 16    Date for PT Re-Evaluation 09/08/20    Progress Note Due on Visit 10    PT Start Time 1603    PT Stop Time 1641    PT Time Calculation (min) 38 min    Activity Tolerance Patient tolerated treatment well    Behavior During Therapy [facility] for tasks assessed/performed           History reviewed. No pertinent past medical history.  Past Surgical History:  Procedure Laterality Date  . laproscopic shoulder      There were no vitals filed for this visit.   Subjective Assessment - 08/03/20 1613    Subjective Pt. indicated feeling improvement in shoulder and ankle symptoms.  Pt. stated he did some return to gym recently with better results.  Went hiking 6 miles with some mild swelling afterwards.    Pertinent History Last MD visit update: HPI: Pt is a 24 y/o male presenting w/ c/o R ankle pain and swelling.  He initially inured his R ankle in mid-April 2021 when he sprained his ankle while playing tennis.  He was initially seen at the [facility] on 04/11/20 and was provided w/ crutches and an ankle brace.  His R ankle pain has improved but he continued to have pain and swelling in his ankle w/ tennis activity.  He locates his pain to his R lateral ankle w/ some radiating pain into the R lower leg/calf.    Limitations Standing;Walking    Diagnostic tests Xray, ultrasoud    Patient Stated Goals Reduce pain, improve function, return to sport.    Currently in Pain? No/denies    Pain Score 0-No pain    Pain Onset More than a month ago    Pain Score 0    Pain Onset More than a month ago                               [facility] Adult PT Treatment/Exercise - 08/03/20 0001      Shoulder Exercises: ROM/Strengthening   UBE (Upper Arm Bike) Lvl 2.5 3 mins fwd/back each way      Manual Therapy   Manual therapy comments compression to Lt infraspinatus, prom                       PT Long Term Goals - 07/28/20 1625      PT LONG TERM GOAL #1   Title Patient will demonstrate/report pain at worst less than or equal to 2/10 to facilitate minimal limitation in daily activity secondary to pain symptoms.    Status On-going    Target Date 09/08/20      PT LONG TERM GOAL #2   Title Patient will demonstrate independent use of home exercise program to facilitate ability to maintain/progress functional gains from skilled physical therapy services.    Status On-going    Target Date 09/08/20      PT LONG TERM GOAL #3   Title Patient will demonstrate return to work/recreational activity at previous  level of function without limitations secondary due to condition    Status On-going    Target Date 09/08/20      PT LONG TERM GOAL #4   Title Pt. will demonstrate Rt ankle DF equal to Lt to facilitate normalized movement in gait and squat movements at PLOF.    Status On-going    Target Date 09/08/20      PT LONG TERM GOAL #5   Title Pt. will demonstrate Rt ankle MMT 5/5 throughout to facilitate return to PLOF in recreational activity.    Status On-going    Target Date 09/08/20      Additional Long Term Goals   Additional Long Term Goals Yes      PT LONG TERM GOAL #6   Title Pt. will demonstrate Lt UE MMT 5/5 throughout to facilitate usual daily and recreational activity at PLOF s limitation.    Status New    Target Date 09/08/20                 Plan - 08/03/20 1614    Clinical Impression Statement Positive improvements noted at this time related to both treatment areas.  Pt. is able and recommended to continue to increase activity load to previous  level based off symptom responses.  Continued use of HEP indicated and skilled PT for near future symptom reductions.    Examination-Activity Limitations Squat;Stairs;Stand;Locomotion Level    Examination-Participation Restrictions Community Activity    Stability/Clinical Decision Making Stable/Uncomplicated    Rehab Potential Good    PT Frequency 2x / week   1-2x/week   PT Duration 6 weeks    PT Treatment/Interventions ADLs/Self Care Home Management;Electrical Stimulation;Iontophoresis 4mg /ml Dexamethasone;Moist Heat;Balance training;Therapeutic exercise;Therapeutic activities;Functional mobility training;Stair training;Cryotherapy;Gait training;Patient/family education;Ultrasound;Neuromuscular re-education;Manual techniques;Vasopneumatic Device;Taping;Dry needling;Passive range of motion;Spinal Manipulations;Joint Manipulations    PT Next Visit Plan DN, UE and LE strengthening.    PT Home Exercise Plan [facility]    Consulted and Agree with Plan of Care Patient           Patient will benefit from skilled therapeutic intervention in order to improve the following deficits and impairments:  Abnormal gait, Decreased endurance, Hypomobility, Decreased activity tolerance, Decreased strength, Pain, Difficulty walking, Decreased mobility, Decreased balance, Decreased range of motion, Impaired perceived functional ability, Decreased coordination, Impaired tone, Increased edema, Impaired UE functional use, Increased fascial restricitons  Visit Diagnosis: Pain in right ankle and joints of right foot  Muscle weakness (generalized)  Acute pain of left shoulder  Difficulty in walking, not elsewhere classified  Localized edema     Problem List There are no problems to display for this patient.   [facility], PT, DPT, OCS, ATC 08/03/20  4:39 PM    [facility] Physical Therapy [address] [place], [place], [place] Phone: [phone]   Fax:  [phone]  Name:  Gary Mckay MRN: [name] Date of Birth: Nov 30, 1996

## 2020-08-11 ENCOUNTER — Ambulatory Visit (INDEPENDENT_AMBULATORY_CARE_PROVIDER_SITE_OTHER): Payer: 59 | Admitting: Rehabilitative and Restorative Service Providers"

## 2020-08-11 ENCOUNTER — Encounter: Payer: Self-pay | Admitting: Rehabilitative and Restorative Service Providers"

## 2020-08-11 ENCOUNTER — Other Ambulatory Visit: Payer: Self-pay

## 2020-08-11 DIAGNOSIS — M25571 Pain in right ankle and joints of right foot: Secondary | ICD-10-CM

## 2020-08-11 DIAGNOSIS — R600 Localized edema: Secondary | ICD-10-CM

## 2020-08-11 DIAGNOSIS — M6281 Muscle weakness (generalized): Secondary | ICD-10-CM

## 2020-08-11 DIAGNOSIS — M25512 Pain in left shoulder: Secondary | ICD-10-CM | POA: Diagnosis not present

## 2020-08-11 DIAGNOSIS — R262 Difficulty in walking, not elsewhere classified: Secondary | ICD-10-CM | POA: Diagnosis not present

## 2020-08-11 NOTE — Therapy (Signed)
[facility] Physical Therapy [address] [place], [place], [address] Phone: [phone]   Fax:  [phone]  Physical Therapy Treatment  Patient Details  Name: Gary Mckay MRN: [id number] Date of Birth: June 12, 1996 Referring Provider (PT): Dr. Clementeen Graham   Encounter Date: 08/11/2020   PT End of Session - 08/11/20 1523    Visit Number 5    Number of Visits 16    Date for PT Re-Evaluation 09/08/20    Progress Note Due on Visit 10    PT Start Time 1522    PT Stop Time 1600    PT Time Calculation (min) 38 min    Activity Tolerance Patient tolerated treatment well    Behavior During Therapy [facility] for tasks assessed/performed           History reviewed. No pertinent past medical history.  Past Surgical History:  Procedure Laterality Date   laproscopic shoulder      There were no vitals filed for this visit.   Subjective Assessment - 08/11/20 1528    Subjective Pt. stated less pain overall in shoulder.  Some instability feeling at times reported.  Pt. indicated ankle doing better as well.    Pertinent History Last MD visit update: HPI: Pt is a 24 y/o male presenting w/ c/o R ankle pain and swelling.  He initially inured his R ankle in mid-April 2021 when he sprained his ankle while playing tennis.  He was initially seen at the [facility] on 04/11/20 and was provided w/ crutches and an ankle brace.  His R ankle pain has improved but he continued to have pain and swelling in his ankle w/ tennis activity.  He locates his pain to his R lateral ankle w/ some radiating pain into the R lower leg/calf.    Limitations Standing;Walking    Diagnostic tests Xray, ultrasoud    Patient Stated Goals Reduce pain, improve function, return to sport.    Currently in Pain? No/denies    Pain Score 0-No pain    Pain Onset More than a month ago    Multiple Pain Sites Yes    Pain Score 0    Pain Onset More than a month ago                              [facility] Adult PT Treatment/Exercise - 08/11/20 0001      Shoulder Exercises: Standing   Other Standing Exercises kettle bell 90/90 body rotation way 2 x 10    Other Standing Exercises Bodyblade 30 sec bouts x 4 er/ir, 90 deg flexion, abd 30 sec 2 each Lt UE      Shoulder Exercises: ROM/Strengthening   UBE (Upper Arm Bike) Lvl 3.5 5 mins fwd/back each way c 10 seconds interval each minute                       PT Long Term Goals - 07/28/20 1625      PT LONG TERM GOAL #1   Title Patient will demonstrate/report pain at worst less than or equal to 2/10 to facilitate minimal limitation in daily activity secondary to pain symptoms.    Status On-going    Target Date 09/08/20      PT LONG TERM GOAL #2   Title Patient will demonstrate independent use of home exercise program to facilitate ability to maintain/progress functional gains from skilled physical therapy services.  Status On-going    Target Date 09/08/20      PT LONG TERM GOAL #3   Title Patient will demonstrate return to work/recreational activity at previous level of function without limitations secondary due to condition    Status On-going    Target Date 09/08/20      PT LONG TERM GOAL #4   Title Pt. will demonstrate Rt ankle DF equal to Lt to facilitate normalized movement in gait and squat movements at PLOF.    Status On-going    Target Date 09/08/20      PT LONG TERM GOAL #5   Title Pt. will demonstrate Rt ankle MMT 5/5 throughout to facilitate return to PLOF in recreational activity.    Status On-going    Target Date 09/08/20      Additional Long Term Goals   Additional Long Term Goals Yes      PT LONG TERM GOAL #6   Title Pt. will demonstrate Lt UE MMT 5/5 throughout to facilitate usual daily and recreational activity at PLOF s limitation.    Status New    Target Date 09/08/20                 Plan - 08/11/20 1541    Clinical Impression Statement Pt.  continued to reported reduction in overall symptoms in both areas as well as transitioning back to usual recreational activity c modifications (lighter weight in gym, etc).  Pt. continued to present c indication for improved proprioceptive control for Lt UE and endurace to improve control/positioning.    Examination-Activity Limitations Squat;Stairs;Stand;Locomotion Level    Examination-Participation Restrictions Community Activity    Stability/Clinical Decision Making Stable/Uncomplicated    Rehab Potential Good    PT Frequency 2x / week   1-2x/week   PT Duration 6 weeks    PT Treatment/Interventions ADLs/Self Care Home Management;Electrical Stimulation;Iontophoresis 4mg /ml Dexamethasone;Moist Heat;Balance training;Therapeutic exercise;Therapeutic activities;Functional mobility training;Stair training;Cryotherapy;Gait training;Patient/family education;Ultrasound;Neuromuscular re-education;Manual techniques;Vasopneumatic Device;Taping;Dry needling;Passive range of motion;Spinal Manipulations;Joint Manipulations    PT Next Visit Plan Focus on HEP for ankle, overhead strength/stabilizations.    PT Home Exercise Plan [facility]    Consulted and Agree with Plan of Care Patient           Patient will benefit from skilled therapeutic intervention in order to improve the following deficits and impairments:  Abnormal gait, Decreased endurance, Hypomobility, Decreased activity tolerance, Decreased strength, Pain, Difficulty walking, Decreased mobility, Decreased balance, Decreased range of motion, Impaired perceived functional ability, Decreased coordination, Impaired tone, Increased edema, Impaired UE functional use, Increased fascial restricitons  Visit Diagnosis: Pain in right ankle and joints of right foot  Muscle weakness (generalized)  Acute pain of left shoulder  Difficulty in walking, not elsewhere classified  Localized edema     Problem List There are no problems to display for this  patient.  [facility], PT, DPT, OCS, ATC 08/11/20  3:53 PM    [facility] Physical Therapy [address] [place], [place], [place] Phone: [phone]   Fax:  [phone]  Name: Abdou Stocks MRN: [name] Date of Birth: 11-Dec-1996

## 2020-08-16 ENCOUNTER — Encounter: Payer: Self-pay | Admitting: Family Medicine

## 2020-08-16 DIAGNOSIS — M25512 Pain in left shoulder: Secondary | ICD-10-CM

## 2020-08-17 ENCOUNTER — Other Ambulatory Visit: Payer: Self-pay

## 2020-08-17 ENCOUNTER — Ambulatory Visit (INDEPENDENT_AMBULATORY_CARE_PROVIDER_SITE_OTHER): Payer: 59 | Admitting: Rehabilitative and Restorative Service Providers"

## 2020-08-17 ENCOUNTER — Ambulatory Visit (INDEPENDENT_AMBULATORY_CARE_PROVIDER_SITE_OTHER): Payer: 59

## 2020-08-17 ENCOUNTER — Encounter: Payer: Self-pay | Admitting: Rehabilitative and Restorative Service Providers"

## 2020-08-17 DIAGNOSIS — R600 Localized edema: Secondary | ICD-10-CM

## 2020-08-17 DIAGNOSIS — M25512 Pain in left shoulder: Secondary | ICD-10-CM

## 2020-08-17 DIAGNOSIS — M6281 Muscle weakness (generalized): Secondary | ICD-10-CM

## 2020-08-17 DIAGNOSIS — M25571 Pain in right ankle and joints of right foot: Secondary | ICD-10-CM | POA: Diagnosis not present

## 2020-08-17 DIAGNOSIS — R262 Difficulty in walking, not elsewhere classified: Secondary | ICD-10-CM | POA: Diagnosis not present

## 2020-08-17 NOTE — Therapy (Signed)
[facility] [address] [place], [place], [address] Phone: [phone]   Fax:  [phone]  Physical Therapy Treatment  Patient Details  Name: Gary Mckay MRN: [id number] Date of Birth: 1996-04-21 Referring Provider (PT): Dr. Clementeen Graham   Encounter Date: 08/17/2020   PT End of Session - 08/17/20 1546    Visit Number 6    Number of Visits 16    Date for PT Re-Evaluation 09/08/20    Progress Note Due on Visit 10    PT Start Time 1523    PT Stop Time 1601    PT Time Calculation (min) 38 min    Activity Tolerance Patient tolerated treatment well    Behavior During Therapy [facility] for tasks assessed/performed           History reviewed. No pertinent past medical history.  Past Surgical History:  Procedure Laterality Date  . laproscopic shoulder      There were no vitals filed for this visit.   Subjective Assessment - 08/17/20 1606    Subjective Pt. indicated he felt good until increase workouts and felt some soreness and some muscle tenderness in surrounding muscles to Lt shoulder.  Pt. denied similar original pain presentation.  Pt. contacted MD through mychart and discuss/scheduled MRI.    Pertinent History Last MD visit update: HPI: Pt is a 24 y/o male presenting w/ c/o R ankle pain and swelling.  He initially inured his R ankle in mid-April 2021 when he sprained his ankle while playing tennis.  He was initially seen at the [facility] on 04/11/20 and was provided w/ crutches and an ankle brace.  His R ankle pain has improved but he continued to have pain and swelling in his ankle w/ tennis activity.  He locates his pain to his R lateral ankle w/ some radiating pain into the R lower leg/calf.    Limitations Standing;Walking    Diagnostic tests Xray, ultrasoud    Patient Stated Goals Reduce pain, improve function, return to sport.    Currently in Pain? No/denies    Pain Score 0-No pain    Pain Onset More than a month ago      Pain Score 0    Pain Location Shoulder    Pain Orientation Left    Pain Onset More than a month ago    Aggravating Factors  soreness after workouts but not original pain.                             [facility] Adult PT Treatment/Exercise - 08/17/20 0001      Self-Care   Other Self-Care Comments  Self care education given regarding post workout soreness response, cues for adjustment of return to higher function activity to limit DOMS.  Education given regarding imaging rationale in general (ie what it shows, why imaging would be/not be done etc).        Exercises   Other Exercises  HEP review of progression based off symptom response.       Shoulder Exercises: Standing   Horizontal ABduction Both   3 x 10 blue anterior blue band   External Rotation Strengthening;Left;Other (comment)    Theraband Level (Shoulder External Rotation) Level 4 (Blue)   flexion punch 3 x 15     Shoulder Exercises: ROM/Strengthening   UBE (Upper Arm Bike) Lvl 4 7 mins fwd, 4 mins backward  PT Long Term Goals - 07/28/20 1625      PT LONG TERM GOAL #1   Title Patient will demonstrate/report pain at worst less than or equal to 2/10 to facilitate minimal limitation in daily activity secondary to pain symptoms.    Status On-going    Target Date 09/08/20      PT LONG TERM GOAL #2   Title Patient will demonstrate independent use of home exercise program to facilitate ability to maintain/progress functional gains from skilled physical therapy services.    Status On-going    Target Date 09/08/20      PT LONG TERM GOAL #3   Title Patient will demonstrate return to work/recreational activity at previous level of function without limitations secondary due to condition    Status On-going    Target Date 09/08/20      PT LONG TERM GOAL #4   Title Pt. will demonstrate Rt ankle DF equal to Lt to facilitate normalized movement in gait and squat movements at PLOF.     Status On-going    Target Date 09/08/20      PT LONG TERM GOAL #5   Title Pt. will demonstrate Rt ankle MMT 5/5 throughout to facilitate return to PLOF in recreational activity.    Status On-going    Target Date 09/08/20      Additional Long Term Goals   Additional Long Term Goals Yes      PT LONG TERM GOAL #6   Title Pt. will demonstrate Lt UE MMT 5/5 throughout to facilitate usual daily and recreational activity at PLOF s limitation.    Status New    Target Date 09/08/20                 Plan - 08/17/20 1548    Clinical Impression Statement Pt. reported peri GH muscular fatigue/soreness post workout activity.  Pt. indicated contact to MD office and possible MRI to be performed.  In convervation c Pt., description of symptoms did not match original complaints and seemed more in line with muscle fatigue.  Advised Pt. to evaluate plan going forward.with advice on HEP for continued strengthening/control, MRI as desired.    Examination-Activity Limitations Squat;Stairs;Stand;Locomotion Level    Examination-Participation Restrictions Community Activity    Stability/Clinical Decision Making Stable/Uncomplicated    Rehab Potential Good    PT Frequency 2x / week   1-2x/week   PT Duration 6 weeks    PT Treatment/Interventions ADLs/Self Care Home Management;Electrical Stimulation;Iontophoresis 4mg /ml Dexamethasone;Moist Heat;Balance training;Therapeutic exercise;Therapeutic activities;Functional mobility training;Stair training;Cryotherapy;Gait training;Patient/family education;Ultrasound;Neuromuscular re-education;Manual techniques;Vasopneumatic Device;Taping;Dry needling;Passive range of motion;Spinal Manipulations;Joint Manipulations    PT Next Visit Plan Suggest continued HEP, possible MRI based off Pt. choice.  Pt. moving out of town soon. D/C upon moving away    PT Home Exercise Plan [facility]    Consulted and Agree with Plan of Care Patient           Patient will benefit from  skilled therapeutic intervention in order to improve the following deficits and impairments:  Abnormal gait, Decreased endurance, Hypomobility, Decreased activity tolerance, Decreased strength, Pain, Difficulty walking, Decreased mobility, Decreased balance, Decreased range of motion, Impaired perceived functional ability, Decreased coordination, Impaired tone, Increased edema, Impaired UE functional use, Increased fascial restricitons  Visit Diagnosis: Pain in right ankle and joints of right foot  Muscle weakness (generalized)  Acute pain of left shoulder  Difficulty in walking, not elsewhere classified  Localized edema     Problem List There are no problems to  display for this patient.  Chyrel Masson, PT, DPT, OCS, ATC 08/17/20  4:52 PM    [facility] [place] [facility] Therapy [address] [place], [place], [address] Phone: [phone]   Fax:  [phone]  Name: Gary Mckay MRN: [id number] Date of Birth: 04/15/1996

## 2020-08-18 ENCOUNTER — Other Ambulatory Visit: Payer: 59

## 2020-08-18 NOTE — Progress Notes (Signed)
Left shoulder x-ray looks pretty normal to radiology.

## 2020-08-23 ENCOUNTER — Ambulatory Visit (INDEPENDENT_AMBULATORY_CARE_PROVIDER_SITE_OTHER): Payer: 59 | Admitting: Sports Medicine

## 2020-08-23 ENCOUNTER — Other Ambulatory Visit: Payer: Self-pay

## 2020-08-23 ENCOUNTER — Ambulatory Visit (INDEPENDENT_AMBULATORY_CARE_PROVIDER_SITE_OTHER): Payer: 59

## 2020-08-23 DIAGNOSIS — S43432S Superior glenoid labrum lesion of left shoulder, sequela: Secondary | ICD-10-CM | POA: Insufficient documentation

## 2020-08-23 DIAGNOSIS — M25512 Pain in left shoulder: Secondary | ICD-10-CM

## 2020-08-23 NOTE — Assessment & Plan Note (Signed)
Gary Mckay has a history of a labral repair, increasing pain, he is sent here for the MR arthrogram injection. Further treatment per primary treating provider.

## 2020-08-23 NOTE — Progress Notes (Signed)
    Procedures performed today:    Procedure: Real-time Ultrasound Guided gadolinium contrast injection of left glenohumeral joint Device: Samsung HS60  Verbal informed consent obtained.  Time-out conducted.  Noted no overlying erythema, induration, or other signs of local infection.  Skin prepped in a sterile fashion.  Local anesthesia: Topical Ethyl chloride.  With sterile technique and under real time ultrasound guidance: I advanced a 22-gauge needle into the glenohumeral joint from a posterior approach, 1 cc kenalog 40, 2 cc lidocaine, 2 cc bupivacaine injected, syringe switched and 0.1 cc gadolinium injected, syringe again switched and 10 cc sterile saline used to distend the joint. Joint visualized and capsule seen distending confirming intra-articular placement of contrast material and medication. Completed without difficulty  Advised to call if fevers/chills, erythema, induration, drainage, or persistent bleeding.  Images permanently stored and available for review in the ultrasound unit.  Impression: Technically successful ultrasound guided gadolinium contrast injection for MR arthrography.  Please see separate MR arthrogram report.  ___________________________________________ Ihor Austin. Benjamin Stain, M.D., ABFM., CAQSM. Primary Care and Sports Medicine [facility]   Adjunct Instructor of Family Medicine  [employer]  Independent interpretation of notes and tests performed by another provider:   None.  Brief History, Exam, Impression, and Recommendations:    Labral tear of shoulder, left, sequela Gary Mckay has a history of a labral repair, increasing pain, he is sent here for the MR arthrogram injection. Further treatment per primary treating provider.    ___________________________________________ Ihor Austin. Benjamin Stain, M.D., ABFM., CAQSM. Primary Care and Sports Medicine  [facility]  Adjunct Instructor of Family Medicine  [employer]

## 2020-08-24 ENCOUNTER — Encounter: Payer: Self-pay | Admitting: Rehabilitative and Restorative Service Providers"

## 2020-08-24 ENCOUNTER — Ambulatory Visit (INDEPENDENT_AMBULATORY_CARE_PROVIDER_SITE_OTHER): Payer: 59 | Admitting: Rehabilitative and Restorative Service Providers"

## 2020-08-24 DIAGNOSIS — M25512 Pain in left shoulder: Secondary | ICD-10-CM

## 2020-08-24 DIAGNOSIS — R262 Difficulty in walking, not elsewhere classified: Secondary | ICD-10-CM

## 2020-08-24 DIAGNOSIS — M25571 Pain in right ankle and joints of right foot: Secondary | ICD-10-CM | POA: Diagnosis not present

## 2020-08-24 DIAGNOSIS — M6281 Muscle weakness (generalized): Secondary | ICD-10-CM | POA: Diagnosis not present

## 2020-08-24 DIAGNOSIS — R600 Localized edema: Secondary | ICD-10-CM

## 2020-08-24 NOTE — Progress Notes (Signed)
MRI shows a possible small tear a the posterior labrum vs post operative findings.  The main thing is there is not a big labrum tear seen again. Would recommend continued PT and if not better would recommend surgical referral.  If you would like to schedule a visit with me to review the MRI findings in detail schedule with me. Otherwise continue PT.

## 2020-08-24 NOTE — Therapy (Signed)
[facility] Therapy [address] [place], [place], [address] Phone: [phone]   Fax:  [phone]  Physical Therapy Treatment  Patient Details  Name: Gary Mckay MRN: [id number] Date of Birth: 09-29-1996 Referring Provider (PT): Dr. Clementeen Graham   Encounter Date: 08/24/2020   PT End of Session - 08/24/20 1613    Visit Number 7    Number of Visits 16    Date for PT Re-Evaluation 09/08/20    Progress Note Due on Visit 10    PT Start Time 1602    PT Stop Time 1640    PT Time Calculation (min) 38 min    Activity Tolerance Patient tolerated treatment well    Behavior During Therapy [facility] for tasks assessed/performed           History reviewed. No pertinent past medical history.  Past Surgical History:  Procedure Laterality Date  . laproscopic shoulder      There were no vitals filed for this visit.   Subjective Assessment - 08/24/20 1605    Subjective Pt. indicated he continued c MRI and results were mostly "inconclusive" for any damage.  Pt. stated this was a good week overall but hasn't been lifting much.    Pertinent History Last MD visit update: HPI: Pt is a 24 y/o male presenting w/ c/o R ankle pain and swelling.  He initially inured his R ankle in mid-April 2021 when he sprained his ankle while playing tennis.  He was initially seen at the [facility] on 04/11/20 and was provided w/ crutches and an ankle brace.  His R ankle pain has improved but he continued to have pain and swelling in his ankle w/ tennis activity.  He locates his pain to his R lateral ankle w/ some radiating pain into the R lower leg/calf.    Limitations Standing;Walking    Diagnostic tests Xray, ultrasoud    Patient Stated Goals Reduce pain, improve function, return to sport.    Currently in Pain? No/denies    Pain Score 0-No pain    Pain Onset More than a month ago    Pain Score 0    Pain Onset More than a month ago                                           PT Long Term Goals - 07/28/20 1625      PT LONG TERM GOAL #1   Title Patient will demonstrate/report pain at worst less than or equal to 2/10 to facilitate minimal limitation in daily activity secondary to pain symptoms.    Status On-going    Target Date 09/08/20      PT LONG TERM GOAL #2   Title Patient will demonstrate independent use of home exercise program to facilitate ability to maintain/progress functional gains from skilled physical therapy services.    Status On-going    Target Date 09/08/20      PT LONG TERM GOAL #3   Title Patient will demonstrate return to work/recreational activity at previous level of function without limitations secondary due to condition    Status On-going    Target Date 09/08/20      PT LONG TERM GOAL #4   Title Pt. will demonstrate Rt ankle DF equal to Lt to facilitate normalized movement in gait and squat movements at PLOF.    Status On-going  Target Date 09/08/20      PT LONG TERM GOAL #5   Title Pt. will demonstrate Rt ankle MMT 5/5 throughout to facilitate return to PLOF in recreational activity.    Status On-going    Target Date 09/08/20      Additional Long Term Goals   Additional Long Term Goals Yes      PT LONG TERM GOAL #6   Title Pt. will demonstrate Lt UE MMT 5/5 throughout to facilitate usual daily and recreational activity at PLOF s limitation.    Status New    Target Date 09/08/20                 Plan - 08/24/20 1616    Clinical Impression Statement Overall progression of symptom reduction still present.  Pt. continued to show beneift from skilled PT services to continue progression back to weight lifting based activity and full previous level of function.    Examination-Activity Limitations Squat;Stairs;Stand;Locomotion Level    Examination-Participation Restrictions Community Activity    Stability/Clinical Decision Making Stable/Uncomplicated     Rehab Potential Good    PT Frequency 2x / week   1-2x/week   PT Duration 6 weeks    PT Treatment/Interventions ADLs/Self Care Home Management;Electrical Stimulation;Iontophoresis 4mg /ml Dexamethasone;Moist Heat;Balance training;Therapeutic exercise;Therapeutic activities;Functional mobility training;Stair training;Cryotherapy;Gait training;Patient/family education;Ultrasound;Neuromuscular re-education;Manual techniques;Vasopneumatic Device;Taping;Dry needling;Passive range of motion;Spinal Manipulations;Joint Manipulations    PT Next Visit Plan Continue overall strengthening program.    PT Home Exercise Plan [facility]    Consulted and Agree with Plan of Care Patient           Patient will benefit from skilled therapeutic intervention in order to improve the following deficits and impairments:  Abnormal gait, Decreased endurance, Hypomobility, Decreased activity tolerance, Decreased strength, Pain, Difficulty walking, Decreased mobility, Decreased balance, Decreased range of motion, Impaired perceived functional ability, Decreased coordination, Impaired tone, Increased edema, Impaired UE functional use, Increased fascial restricitons  Visit Diagnosis: Pain in right ankle and joints of right foot  Muscle weakness (generalized)  Acute pain of left shoulder  Difficulty in walking, not elsewhere classified  Localized edema     Problem List Patient Active Problem List   Diagnosis Date Noted  . Labral tear of shoulder, left, sequela 08/23/2020    08/25/2020, PT, DPT, OCS, ATC 08/24/20  4:33 PM    [facility] Physical Therapy [address] [place], [place], [place] Phone: [phone]   Fax:  [phone]  Name: Gary Mckay MRN: [name] Date of Birth: 1996/08/12

## 2020-08-31 ENCOUNTER — Other Ambulatory Visit: Payer: Self-pay

## 2020-08-31 ENCOUNTER — Ambulatory Visit (INDEPENDENT_AMBULATORY_CARE_PROVIDER_SITE_OTHER): Payer: 59 | Admitting: Rehabilitative and Restorative Service Providers"

## 2020-08-31 ENCOUNTER — Encounter: Payer: Self-pay | Admitting: Rehabilitative and Restorative Service Providers"

## 2020-08-31 DIAGNOSIS — M25571 Pain in right ankle and joints of right foot: Secondary | ICD-10-CM | POA: Diagnosis not present

## 2020-08-31 DIAGNOSIS — R600 Localized edema: Secondary | ICD-10-CM

## 2020-08-31 DIAGNOSIS — R262 Difficulty in walking, not elsewhere classified: Secondary | ICD-10-CM

## 2020-08-31 DIAGNOSIS — M25512 Pain in left shoulder: Secondary | ICD-10-CM | POA: Diagnosis not present

## 2020-08-31 DIAGNOSIS — M6281 Muscle weakness (generalized): Secondary | ICD-10-CM

## 2020-08-31 NOTE — Therapy (Addendum)
[facility] Therapy [address] [place], [place], [address] Phone: [phone]   Fax:  [phone]  Physical Therapy Treatment/Discharge  Patient Details  Name: Gary Mckay MRN: [id number] Date of Birth: [birth year] Referring Provider (PT): Dr. Lynne Leader   Encounter Date: 08/31/2020   PT End of Session - 08/31/20 1608    Visit Number 8    Number of Visits 16    Date for PT Re-Evaluation 09/08/20    Progress Note Due on Visit 10    PT Start Time 1604    PT Stop Time 1643    PT Time Calculation (min) 39 min    Activity Tolerance Patient tolerated treatment well    Behavior During Therapy [facility] for tasks assessed/performed           History reviewed. No pertinent past medical history.  Past Surgical History:  Procedure Laterality Date  . laproscopic shoulder      There were no vitals filed for this visit.   Subjective Assessment - 08/31/20 1610    Subjective Pt. stated arm is doing pretty good, pain and weakness is lower.  Pt. stated he has been doing normal day to day stuff and some physical therapy stuff    Pertinent History Last MD visit update: HPI: Pt is a 24 y/o male presenting w/ c/o R ankle pain and swelling.  He initially inured his R ankle in mid-April 2021 when he sprained his ankle while playing tennis.  He was initially seen at the [facility] on 04/11/20 and was provided w/ crutches and an ankle brace.  His R ankle pain has improved but he continued to have pain and swelling in his ankle w/ tennis activity.  He locates his pain to his R lateral ankle w/ some radiating pain into the R lower leg/calf.    Limitations Standing;Walking    Diagnostic tests Xray, ultrasoud    Patient Stated Goals Reduce pain, improve function, return to sport.    Currently in Pain? No/denies    Pain Score 0-No pain    Pain Onset More than a month ago    Multiple Pain Sites No    Pain Score 0    Pain Onset More than a month ago                              [facility] Adult PT Treatment/Exercise - 08/31/20 0001      Shoulder Exercises: Prone   Other Prone Exercises bosu circles x 10 cw, ccw on knees      Shoulder Exercises: Sidelying   Other Sidelying Exercises reactive 2 lbs eccentric ER 30 sec x 5      Shoulder Exercises: Standing   Other Standing Exercises abduction c anterior blue band resistance x 30, standing 90/90 ir, er 3x 10 blue band      Shoulder Exercises: ROM/Strengthening   UBE (Upper Arm Bike) Lvl 3.5 5 mins fwd/back each way                       PT Long Term Goals - 07/28/20 1625      PT LONG TERM GOAL #1   Title Patient will demonstrate/report pain at worst less than or equal to 2/10 to facilitate minimal limitation in daily activity secondary to pain symptoms.    Status On-going    Target Date 09/08/20      PT LONG TERM GOAL #  2   Title Patient will demonstrate independent use of home exercise program to facilitate ability to maintain/progress functional gains from skilled physical therapy services.    Status On-going    Target Date 09/08/20      PT LONG TERM GOAL #3   Title Patient will demonstrate return to work/recreational activity at previous level of function without limitations secondary due to condition    Status On-going    Target Date 09/08/20      PT LONG TERM GOAL #4   Title Pt. will demonstrate Rt ankle DF equal to Lt to facilitate normalized movement in gait and squat movements at PLOF.    Status On-going    Target Date 09/08/20      PT LONG TERM GOAL #5   Title Pt. will demonstrate Rt ankle MMT 5/5 throughout to facilitate return to PLOF in recreational activity.    Status On-going    Target Date 09/08/20      Additional Long Term Goals   Additional Long Term Goals Yes      PT LONG TERM GOAL #6   Title Pt. will demonstrate Lt UE MMT 5/5 throughout to facilitate usual daily and recreational activity at PLOF s limitation.    Status New     Target Date 09/08/20                 Plan - 08/31/20 1611    Clinical Impression Statement Continued improvement in day to day use reported as well as improvement in reduced difficulty on HEP.  Continued to be guarded with return to usual workout activity.    Examination-Activity Limitations Squat;Stairs;Stand;Locomotion Level    Examination-Participation Restrictions Community Activity    Stability/Clinical Decision Making Stable/Uncomplicated    Rehab Potential Good    PT Frequency 2x / week   1-2x/week   PT Duration 6 weeks    PT Treatment/Interventions ADLs/Self Care Home Management;Electrical Stimulation;Iontophoresis 17m/ml Dexamethasone;Moist Heat;Balance training;Therapeutic exercise;Therapeutic activities;Functional mobility training;Stair training;Cryotherapy;Gait training;Patient/family education;Ultrasound;Neuromuscular re-education;Manual techniques;Vasopneumatic Device;Taping;Dry needling;Passive range of motion;Spinal Manipulations;Joint Manipulations    PT Next Visit Plan Continue overall strengthening program.    [facility]   Consulted and Agree with Plan of Care Patient           Patient will benefit from skilled therapeutic intervention in order to improve the following deficits and impairments:  Abnormal gait, Decreased endurance, Hypomobility, Decreased activity tolerance, Decreased strength, Pain, Difficulty walking, Decreased mobility, Decreased balance, Decreased range of motion, Impaired perceived functional ability, Decreased coordination, Impaired tone, Increased edema, Impaired UE functional use, Increased fascial restricitons  Visit Diagnosis: Pain in right ankle and joints of right foot  Muscle weakness (generalized)  Acute pain of left shoulder  Difficulty in walking, not elsewhere classified  Localized edema     Problem List Patient Active Problem List   Diagnosis Date Noted  . Labral tear of shoulder, left, sequela  08/23/2020    MScot Jun PT, DPT, OCS, ATC 08/31/20  4:38 PM  PHYSICAL THERAPY DISCHARGE SUMMARY  Visits from Start of Care: 8  Current functional level related to goals / functional outcomes: See note   Remaining deficits: See note   Education / Equipment: HEP Plan: Patient agrees to discharge.  Patient goals were partially met. Patient is being discharged due to not returning since the last visit.  ?????  MScot Jun PT, DPT, OCS, ATC 10/12/20  2:10 PM        [facility] [address]  [place], [place], [address] Phone: [phone]   Fax:  [phone]  Name: Gary Mckay MRN: [id number] Date of Birth: 1996-01-22

## 2021-05-03 ENCOUNTER — Telehealth: Payer: Self-pay | Admitting: Internal Medicine

## 2021-05-03 NOTE — Telephone Encounter (Signed)
Patient needs to r/s appt on 06/21/21-- provider out of office

## 2021-06-21 ENCOUNTER — Encounter: Payer: 59 | Admitting: Internal Medicine

## 2022-07-27 IMAGING — DX DG SHOULDER 2+V*L*
3 series · 3 of 3 positions shown · non-contrast
Comparison: None.

CLINICAL DATA: Shoulder pain

EXAM:
LEFT SHOULDER - 2+ VIEW

[shoulder ap (1 of 2)]
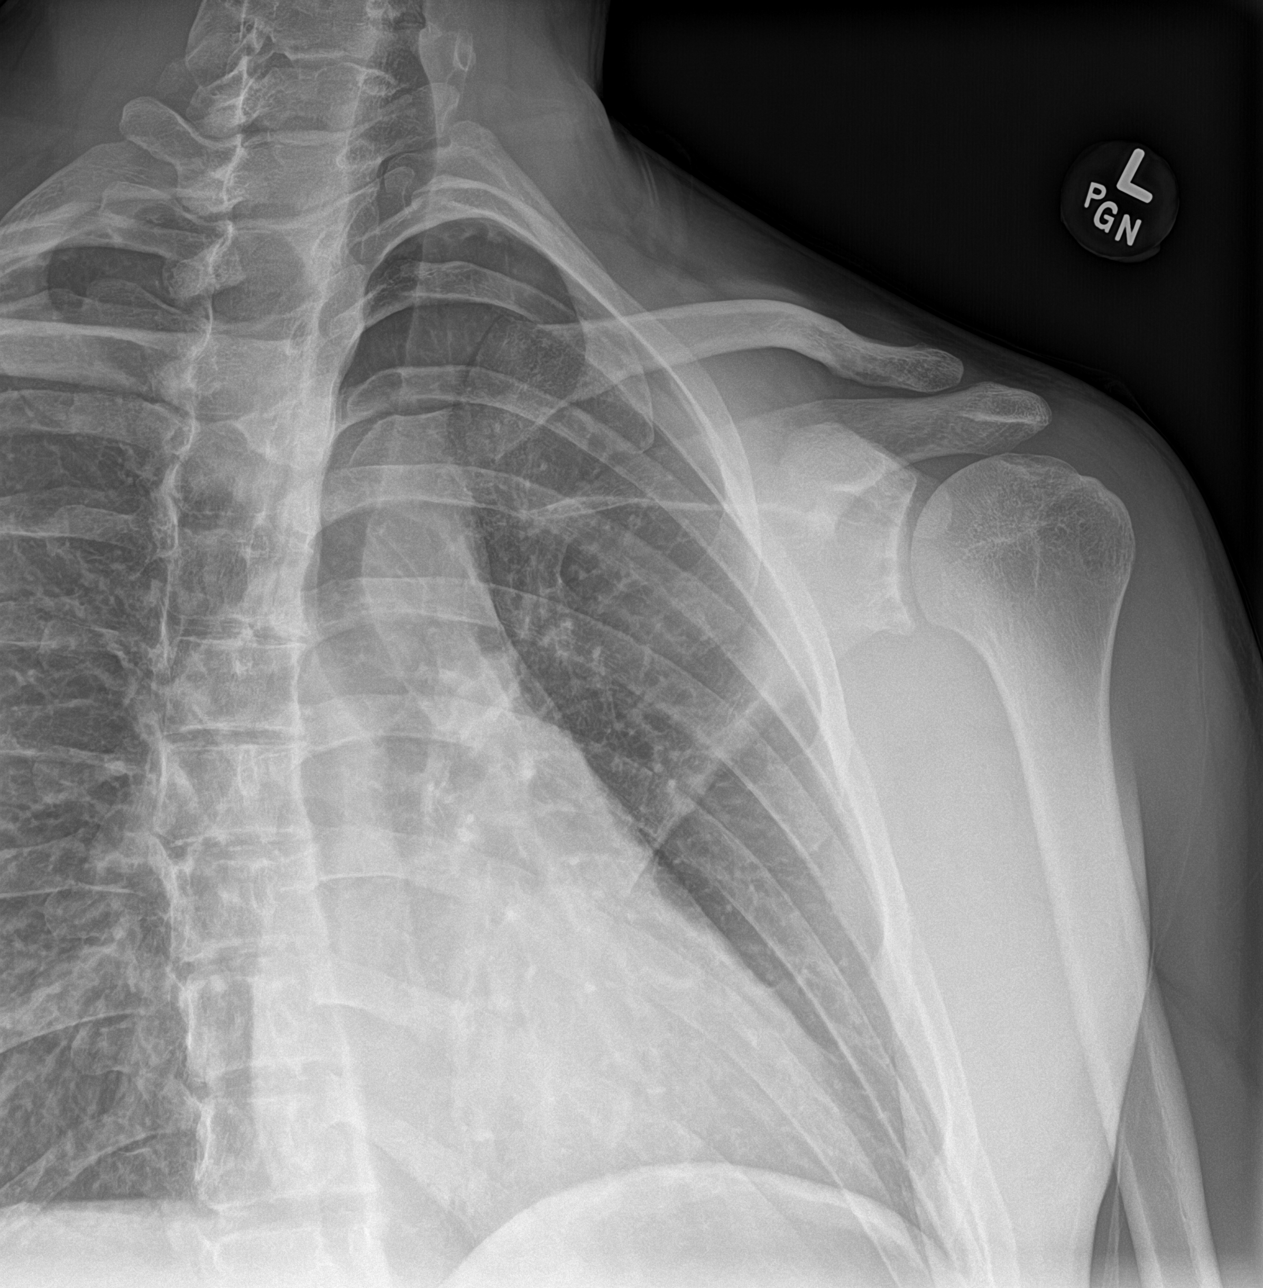

[shoulder ap (2 of 2)]
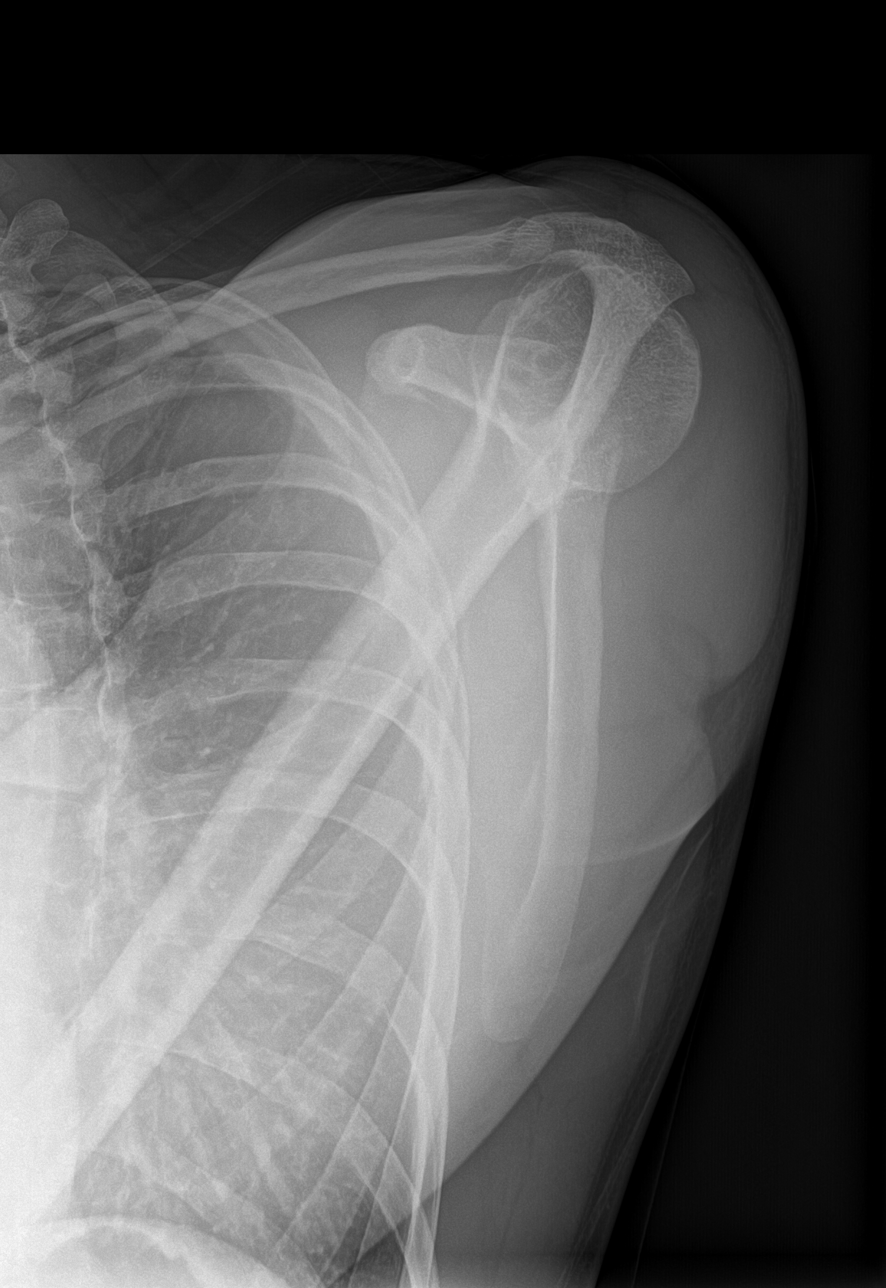

[shoulder axial]
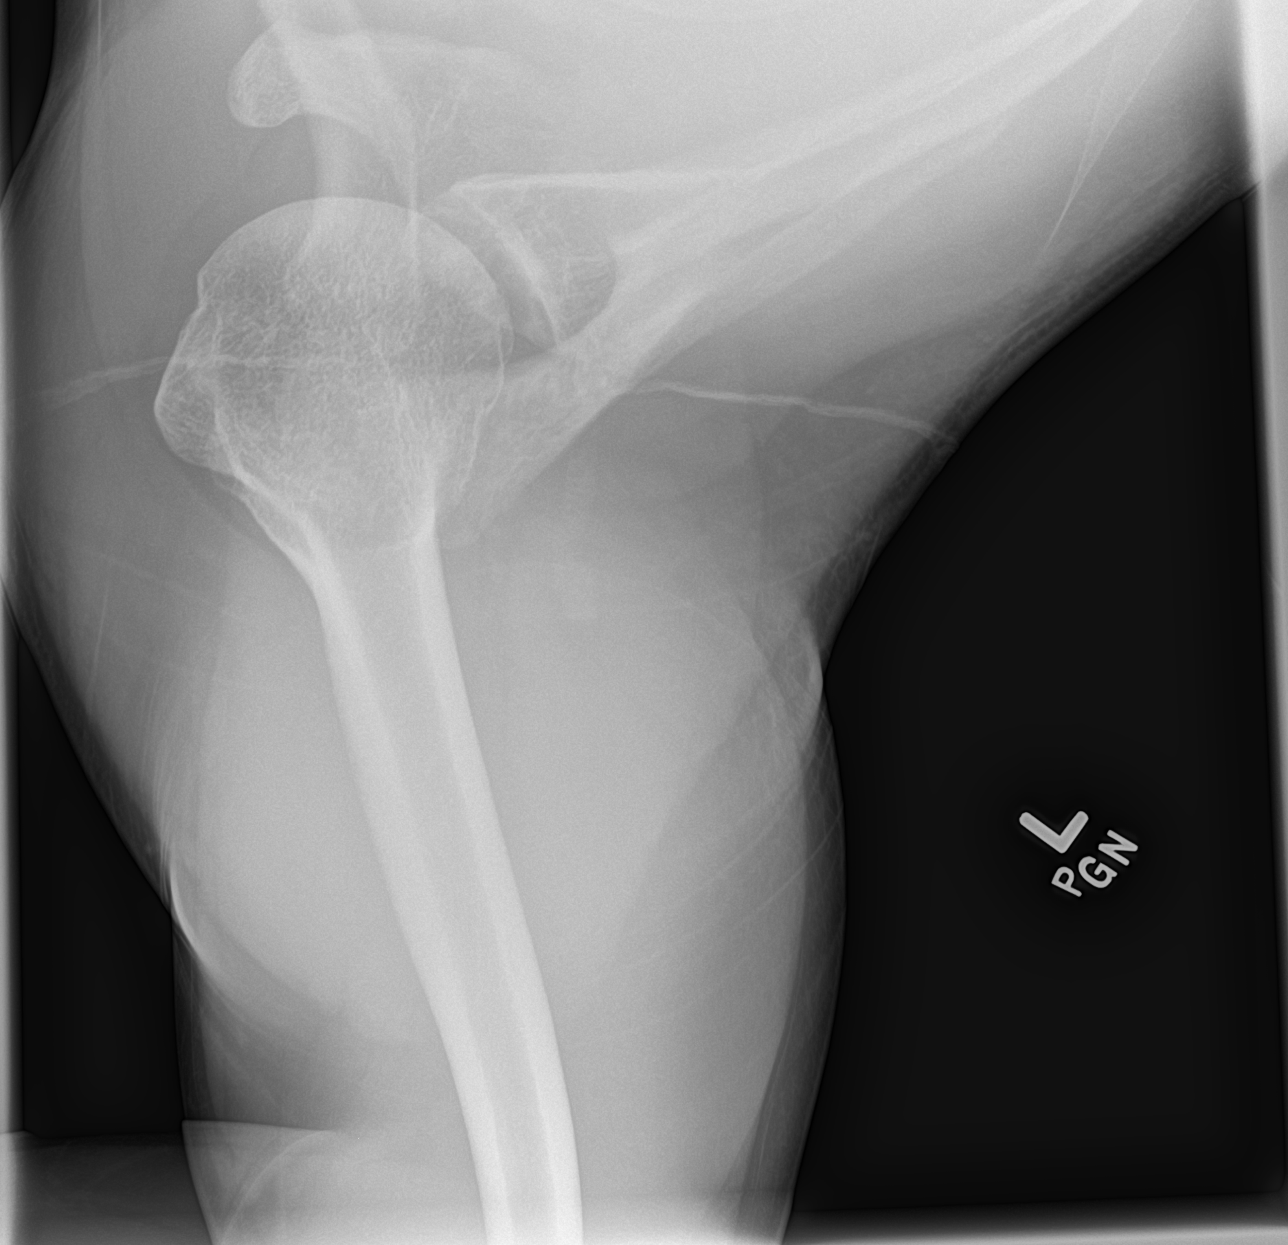

[3 of 3 positions shown; findings below may reference images not displayed]

FINDINGS: There is no evidence of fracture or dislocation. Joint space is
patent. Possible small subarticular cysts at the glenoid. Soft
tissues are unremarkable.
IMPRESSION: No acute osseous abnormality.

## 2022-08-02 IMAGING — MR MR SHOULDER*L* W/ CM
5 series · 40 of 40 positions shown · IV contrast (agent unspecified)
Comparison: Radiographs from 08/17/2020

CLINICAL DATA: Popping injury with pain two months ago with
continued pain since that time. Prior labral repair in 5891. Remote
shoulder injury in February 2016 playing football.

EXAM:
MR ARTHROGRAM OF THE left SHOULDER
TECHNIQUE: Multiplanar, multisequence MR imaging of the left shoulder was
performed following the administration of intra-articular contrast.
CONTRAST:  See Injection Documentation.

[Series 3: T1 fat-sat · axial · 4.0mm · 0.47mm/px · z∈[-35,+65]mm · 8 of 24 slices shown (1 of 3)]
[im 1/24]
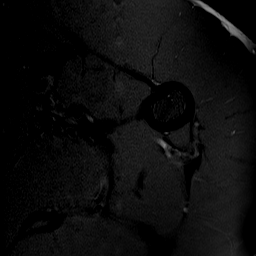
[im 4/24]
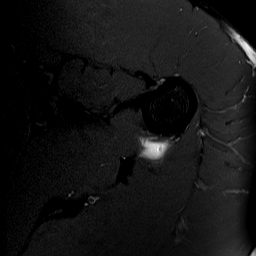
[im 7/24]
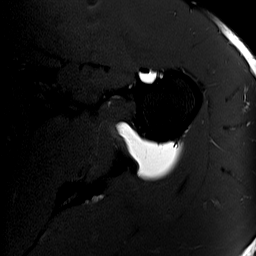
[im 10/24]
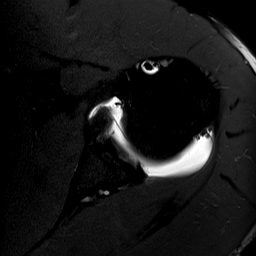
[im 14/24]
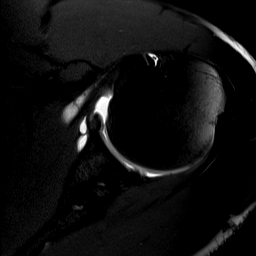
[im 17/24]
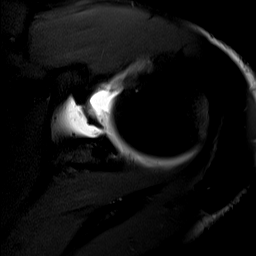
[im 20/24]
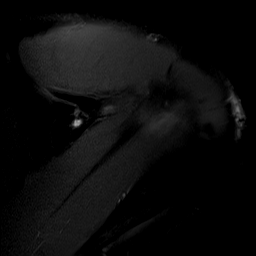
[im 24/24]
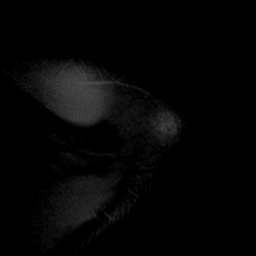

[Series 4: T1 fat-sat · oblique · 4.0mm · 0.55mm/px · 8 of 20 slices shown (2 of 3)]
[im 1/20]
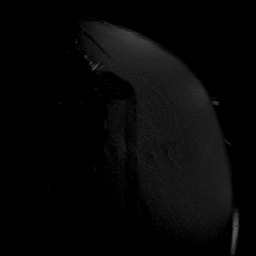
[im 3/20]
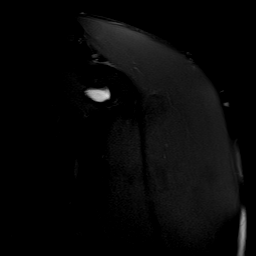
[im 6/20]
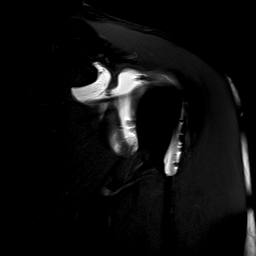
[im 9/20]
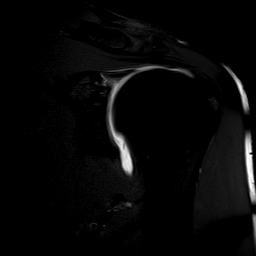
[im 11/20]
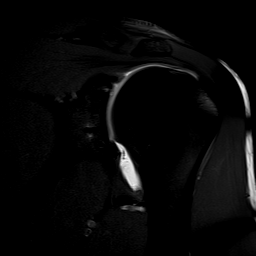
[im 14/20]
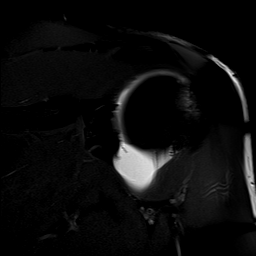
[im 17/20]
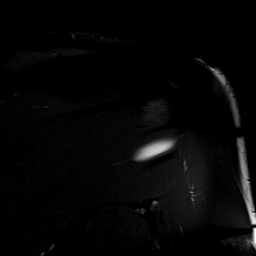
[im 20/20]
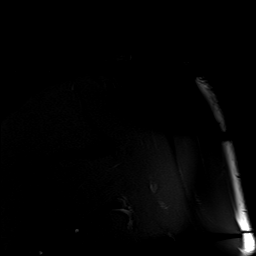

[Series 5: T2 fat-sat · oblique · 4.0mm · 0.55mm/px · 8 of 20 slices shown (1 of 2)]
[im 1/20]
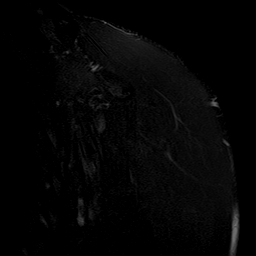
[im 3/20]
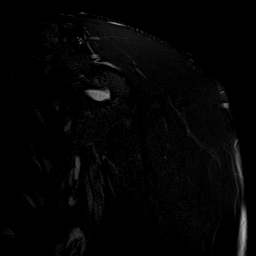
[im 6/20]
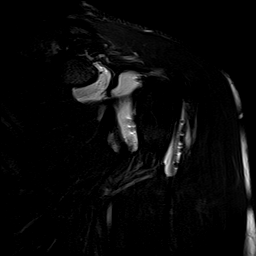
[im 9/20]
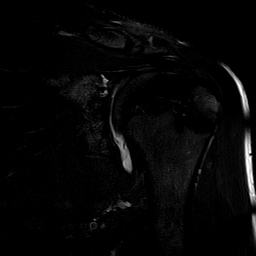
[im 11/20]
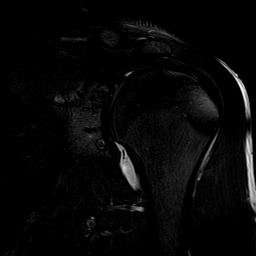
[im 14/20]
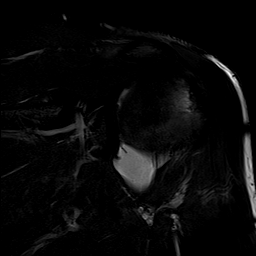
[im 17/20]
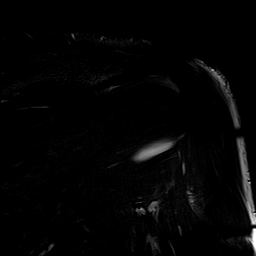
[im 20/20]
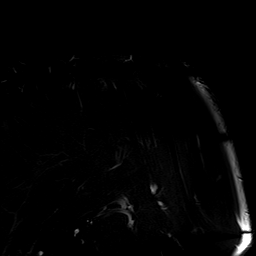

[Series 6: T1 fat-sat · oblique · non-contrast · 4.0mm · 0.44mm/px · 8 of 20 slices shown (3 of 3)]
[im 1/20]
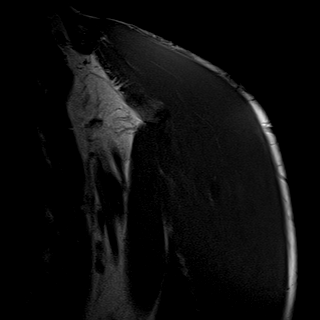
[im 3/20]
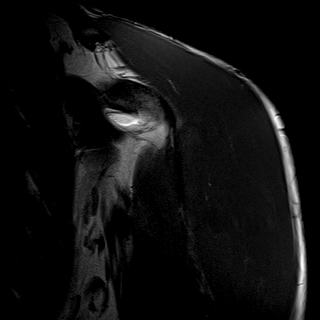
[im 6/20]
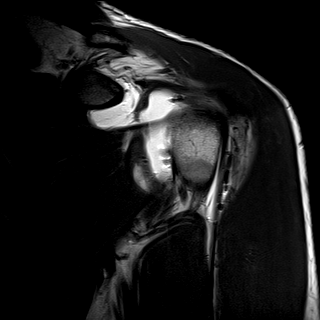
[im 9/20]
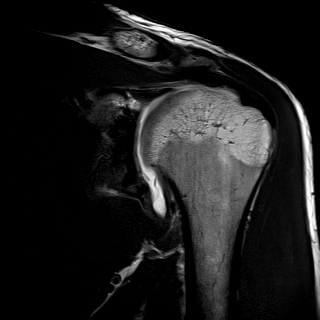
[im 11/20]
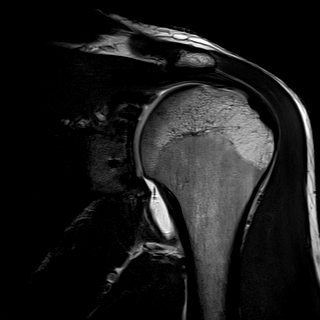
[im 14/20]
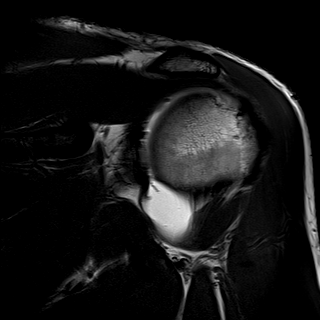
[im 17/20]
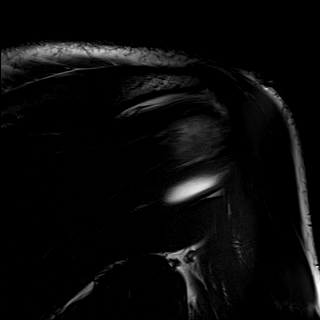
[im 20/20]
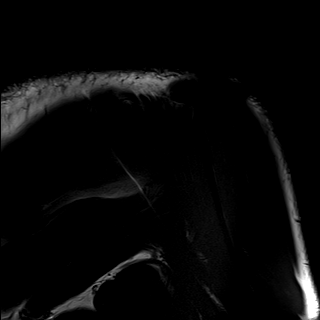

[Series 7: T2 fat-sat · oblique · 4.0mm · 0.55mm/px · 8 of 21 slices shown (2 of 2)]
[im 1/21]
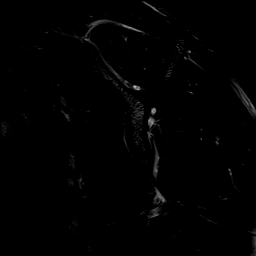
[im 3/21]
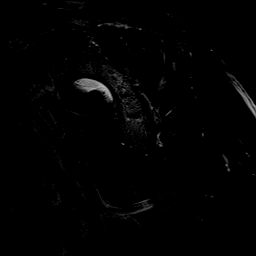
[im 6/21]
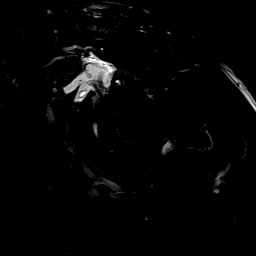
[im 9/21]
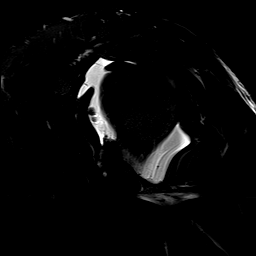
[im 12/21]
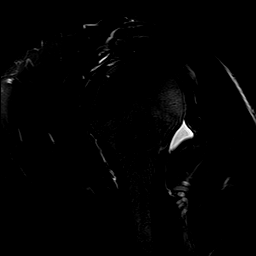
[im 15/21]
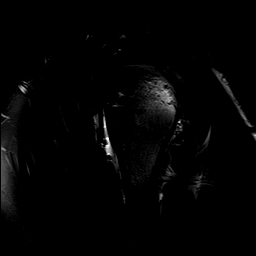
[im 18/21]
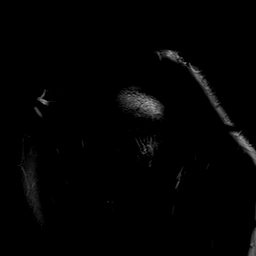
[im 21/21]
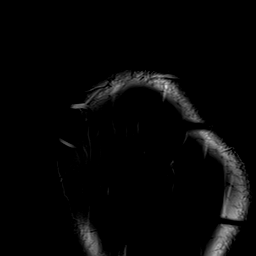

[40 of 40 positions shown; findings below may reference images not displayed]

FINDINGS: Rotator cuff: Unremarkable

Muscles: Unremarkable

Biceps long head: Unremarkable

Acromioclavicular Joint: No significant arthropathy. Subacromial
morphology is type 2 (curved). No significant regional bursitis.

Glenohumeral Joint: The joint is well distended with contrast. Gas
bubbles in the joint likely introduced during injection and likely
clinically insignificant.

Extending transversely in the joint adjacent to the inferior labrum
there is linear filling defect on image [DATE] which is nonspecific,
but I cannot exclude suture material. A small fibrotic band could
also potentially have this appearance. Additional linear material
adjacent to the posteroinferior labrum shown on images 7 through 8
of series 6. Faint linear filling defects in the subcoracoid recess
noted for example on image [DATE].

Labrum: Postoperative linings along the glenoid rim, with low signal
intensity foci observed on the T1 weighted images along with
heterogeneity on the T2 weighted images. Subtle nondisplaced linear
accentuated signal of the posterior superior labrum shown on images
10-11 of series 4, suspicious for a small recurrent tear.

Bones: No significant extra-articular osseous abnormalities
identified.
IMPRESSION: 1. Subtle nondisplaced linear accentuated signal of the posterior
superior labrum shown on images 10-11 of series 4, suspicious for a
small recurrent tear.
2. Postoperative findings along the glenoid rim with low signal
intensity foci observed on the T1 weighted images along the glenoid
rim.
3. There is some linear filling defects in the joint, most
conspicuously adjacent to the inferior labrum, suspicious for
fibrous bands or suture material. There is also some linear filling
defects probably from fibrous bands along the subscapular recess.
